# Patient Record
Sex: Female | Born: 1980 | Race: White | Hispanic: No | Marital: Married | Smoking: Never smoker
Health system: Southern US, Community
[De-identification: ages and names within clinical notes are randomized; demographics above are authoritative.]

---

## 1999-04-09 ENCOUNTER — Encounter: Payer: Self-pay | Admitting: Neurological Surgery

## 1999-04-09 ENCOUNTER — Ambulatory Visit (HOSPITAL_COMMUNITY): Admission: RE | Admit: 1999-04-09 | Discharge: 1999-04-09 | Payer: Self-pay | Admitting: Neurological Surgery

## 1999-06-09 ENCOUNTER — Encounter: Payer: Self-pay | Admitting: Neurological Surgery

## 1999-06-09 ENCOUNTER — Encounter: Admission: RE | Admit: 1999-06-09 | Discharge: 1999-06-09 | Payer: Self-pay | Admitting: Neurological Surgery

## 1999-08-15 ENCOUNTER — Ambulatory Visit (HOSPITAL_COMMUNITY): Admission: RE | Admit: 1999-08-15 | Discharge: 1999-08-15 | Payer: Self-pay | Admitting: Neurosurgery

## 1999-08-15 ENCOUNTER — Emergency Department (HOSPITAL_COMMUNITY): Admission: EM | Admit: 1999-08-15 | Discharge: 1999-08-15 | Payer: Self-pay | Admitting: Emergency Medicine

## 1999-08-15 ENCOUNTER — Encounter: Payer: Self-pay | Admitting: Emergency Medicine

## 2002-11-05 ENCOUNTER — Encounter: Payer: Self-pay | Admitting: Family Medicine

## 2002-11-05 ENCOUNTER — Ambulatory Visit (HOSPITAL_COMMUNITY): Admission: RE | Admit: 2002-11-05 | Discharge: 2002-11-05 | Payer: Self-pay | Admitting: Obstetrics and Gynecology

## 2002-11-05 ENCOUNTER — Encounter: Admission: RE | Admit: 2002-11-05 | Discharge: 2002-11-05 | Payer: Self-pay | Admitting: Family Medicine

## 2002-11-05 ENCOUNTER — Encounter: Payer: Self-pay | Admitting: Obstetrics and Gynecology

## 2003-06-12 ENCOUNTER — Inpatient Hospital Stay (HOSPITAL_COMMUNITY): Admission: EM | Admit: 2003-06-12 | Discharge: 2003-06-15 | Payer: Self-pay | Admitting: *Deleted

## 2003-07-08 ENCOUNTER — Encounter: Admission: RE | Admit: 2003-07-08 | Discharge: 2003-07-08 | Payer: Self-pay | Admitting: Internal Medicine

## 2004-04-25 ENCOUNTER — Emergency Department (HOSPITAL_COMMUNITY): Admission: EM | Admit: 2004-04-25 | Discharge: 2004-04-25 | Payer: Self-pay | Admitting: Emergency Medicine

## 2004-04-27 ENCOUNTER — Emergency Department (HOSPITAL_COMMUNITY): Admission: EM | Admit: 2004-04-27 | Discharge: 2004-04-27 | Payer: Self-pay | Admitting: Emergency Medicine

## 2004-05-11 ENCOUNTER — Encounter: Admission: RE | Admit: 2004-05-11 | Discharge: 2004-05-11 | Payer: Self-pay | Admitting: Internal Medicine

## 2004-08-04 ENCOUNTER — Other Ambulatory Visit: Admission: RE | Admit: 2004-08-04 | Discharge: 2004-08-04 | Payer: Self-pay | Admitting: Obstetrics and Gynecology

## 2005-05-23 ENCOUNTER — Encounter: Admission: RE | Admit: 2005-05-23 | Discharge: 2005-05-23 | Payer: Self-pay | Admitting: Internal Medicine

## 2005-05-25 ENCOUNTER — Encounter: Admission: RE | Admit: 2005-05-25 | Discharge: 2005-06-15 | Payer: Self-pay | Admitting: Internal Medicine

## 2006-03-23 ENCOUNTER — Other Ambulatory Visit: Admission: RE | Admit: 2006-03-23 | Discharge: 2006-03-23 | Payer: Self-pay | Admitting: Obstetrics and Gynecology

## 2007-09-15 ENCOUNTER — Inpatient Hospital Stay (HOSPITAL_COMMUNITY): Admission: AD | Admit: 2007-09-15 | Discharge: 2007-09-15 | Payer: Self-pay | Admitting: Obstetrics and Gynecology

## 2007-09-26 ENCOUNTER — Encounter (INDEPENDENT_AMBULATORY_CARE_PROVIDER_SITE_OTHER): Payer: Self-pay | Admitting: Obstetrics and Gynecology

## 2007-09-26 ENCOUNTER — Ambulatory Visit (HOSPITAL_COMMUNITY): Admission: RE | Admit: 2007-09-26 | Discharge: 2007-09-26 | Payer: Self-pay | Admitting: Obstetrics and Gynecology

## 2009-03-25 ENCOUNTER — Inpatient Hospital Stay (HOSPITAL_COMMUNITY): Admission: AD | Admit: 2009-03-25 | Discharge: 2009-03-28 | Payer: Self-pay | Admitting: Obstetrics and Gynecology

## 2009-03-29 ENCOUNTER — Encounter: Admission: RE | Admit: 2009-03-29 | Discharge: 2009-04-28 | Payer: Self-pay | Admitting: Obstetrics and Gynecology

## 2010-08-21 ENCOUNTER — Encounter: Payer: Self-pay | Admitting: Internal Medicine

## 2010-09-29 ENCOUNTER — Inpatient Hospital Stay (HOSPITAL_COMMUNITY)
Admission: AD | Admit: 2010-09-29 | Discharge: 2010-10-01 | DRG: 775 | Disposition: A | Discharge status: Home / Self Care | Payer: 59 | Source: Ambulatory Visit | Attending: Obstetrics and Gynecology | Admitting: Obstetrics and Gynecology

## 2010-09-29 LAB — CBC
HCT: 38.8 % (ref 36.0–46.0)
Hemoglobin: 13.8 g/dL (ref 12.0–15.0)
MCH: 33 pg (ref 26.0–34.0)
MCHC: 35.6 g/dL (ref 30.0–36.0)
MCV: 92.8 fL (ref 78.0–100.0)
Platelets: 239 10*3/uL (ref 150–400)
RBC: 4.18 MIL/uL (ref 3.87–5.11)
RDW: 12.1 % (ref 11.5–15.5)
WBC: 15.8 10*3/uL — ABNORMAL HIGH (ref 4.0–10.5)

## 2010-09-29 LAB — RPR: RPR Ser Ql: NONREACTIVE

## 2010-09-30 LAB — CBC
HCT: 34 % — ABNORMAL LOW (ref 36.0–46.0)
Hemoglobin: 11.5 g/dL — ABNORMAL LOW (ref 12.0–15.0)
MCH: 31.9 pg (ref 26.0–34.0)
MCHC: 33.8 g/dL (ref 30.0–36.0)
MCV: 94.4 fL (ref 78.0–100.0)
Platelets: 206 10*3/uL (ref 150–400)
RBC: 3.6 MIL/uL — ABNORMAL LOW (ref 3.87–5.11)
RDW: 12.3 % (ref 11.5–15.5)
WBC: 13.9 10*3/uL — ABNORMAL HIGH (ref 4.0–10.5)

## 2010-10-08 NOTE — Discharge Summary (Signed)
  NAME:  Michelle Sullivan, Michelle Sullivan          ACCOUNT NO.:  [id number]  MEDICAL RECORD NO.:  [id number]           PATIENT TYPE:  I  LOCATION:  9125                          FACILITY:  [facility]  PHYSICIAN:  Huel Cote, M.D. DATE OF BIRTH:  02-13-1981  DATE OF ADMISSION:  09/29/2010 DATE OF DISCHARGE:  10/01/2010                              DISCHARGE SUMMARY   DISCHARGE DIAGNOSES: 1. Term pregnancy at 40-6/7 weeks delivered. 2. Status post normal spontaneous vaginal delivery. 3. Second-degree laceration repaired.  DISCHARGE MEDICATIONS:  Motrin 600 mg p.o. every 6 hours.  DISCHARGE FOLLOWUP:  The patient is to follow up in 6 weeks for her full postpartum exam.  HOSPITAL COURSE:  The patient is a 30 year old G3, P1-0-1-1 who was admitted at 45 and 6/[redacted] weeks gestation in active labor.  Cervix on admission was 7-8 cm with intact membranes.  Her prenatal care had been uneventful.  PRENATAL LABS:  A+ antibody negative, rubella immune, hepatitis B surface antigen negative, HIV negative, RPR negative, GC negative, Chlamydia negative, group B strep negative, 1-hour Glucola 103, first trimester screen negative, AFP negative.  PAST OBSTETRICAL HISTORY:  In 2009, she had spontaneous miscarriage with a D&C.  In 2010, she had a vaginal delivery of a 7-pound 11-ounce infant.  PAST GYN HISTORY:  No abnormal Pap smears.  PAST SURGICAL HISTORY:  D&C.  PAST MEDICAL HISTORY:  Anemia, anxiety requiring no medications, C-spine fracture, and a head injury.  ALLERGIES:  AVELOX, BIAXIN, and CEFTIN.  MEDICATIONS:  Folic acid and prenatal vitamins.  SOCIAL HISTORY:  She is married, homemaker, uses no tobacco, alcohol or drugs.  On admission, she was afebrile with stable vital signs.  Fetal heart rate was reassuring.  Cervix was completely effaced 7-8 cm, 0 station and intact bag of water.  She declined pain medications and AROM.  On admission, she did well for the next hour and a half and  reached complete dilation.  At that point, she had rupture of membranes with light meconium noted.  Shortly thereafter, she pushed great with a normal spontaneous vaginal delivery of a vigorous female infant over second-degree laceration.  Apgar's were 8 and 9, weight was 8 pounds 2 ounces.  Estimated blood loss 400 mL.  Her second-degree laceration was repaired with 3-0 Vicryl Rapide.  Cervix and rectum were intact.  On postpartum day #1, she was doing quite well. Hemoglobin was good at 11.5.  By postpartum day #2, she was tolerating her pain with p.o. medications having no difficulty.  Her lochia was slowing and she was considered stable for discharge home.     Huel Cote, M.D.     KR/MEDQ  D:  10/01/2010  T:  10/01/2010  Job:  [id number]  Electronically Signed by Huel Cote M.D. on 10/07/2010 11:19:32 AM

## 2010-11-06 LAB — CBC
HCT: 39.4 % (ref 36.0–46.0)
Hemoglobin: 13.7 g/dL (ref 12.0–15.0)
MCHC: 34.7 g/dL (ref 30.0–36.0)
MCV: 96.6 fL (ref 78.0–100.0)
Platelets: 257 10*3/uL (ref 150–400)
RBC: 4.08 MIL/uL (ref 3.87–5.11)
RDW: 12 % (ref 11.5–15.5)
WBC: 13.8 10*3/uL — ABNORMAL HIGH (ref 4.0–10.5)

## 2010-11-06 LAB — RPR: RPR Ser Ql: NONREACTIVE

## 2010-12-14 NOTE — H&P (Signed)
NAME:  Michelle Sullivan, Michelle Sullivan NO.:  [id number]   MEDICAL RECORD NO.:  [id number]          PATIENT TYPE:  AMB   LOCATION:  SDC                           FACILITY:  [facility]   PHYSICIAN:  Huel Cote, M.D. DATE OF BIRTH:  08/04/1980   DATE OF ADMISSION:  09/26/2007  DATE OF DISCHARGE:                              HISTORY & PHYSICAL   The patient is a 30 year old female, G1 P0, who is coming in for a D&C  for a known missed abortion.  She had been being followed in our office  with inappropriately rising betas and an ultrasound which demonstrated  an  empty gestational sac with no fetal embryo developing.  She was  given multiple options and elected for a D&C.   PAST MEDICAL HISTORY:  Significant for history of compression fractures  at L1, L2 and L3 and migraines.   PAST SURGICAL HISTORY:  None.   PAST OBSTETRICAL HISTORY:  None.   PAST GYN HISTORY:  None.   She has a history of an ovarian cancer in a paternal aunt.  No other  significant medical problems.   ALLERGIES:  BIAXIN, CEFTIN and AVELOX.   CURRENT MEDICATIONS:  Vitamins only.   PHYSICAL EXAM:  Blood pressure is 120/65.  Weight is 139 pounds.  BREASTS:  No masses, discharge or adenopathy noted.  CARDIAC:  Regular rate and rhythm.  LUNGS:  Clear.  ABDOMEN:  Soft and nontender.  She has normal external genitalia.  Cervix is normal.  Uterus is  enlarged to approximately 8 weeks' size and she has small adnexa with no  masses noted.   In short, this is a female who is approximately 9-10 weeks by her last  menstrual period with a missed abortion confirmed on several serial  ultrasounds and a gestational sac with no embryonic pole.  The sac  measures approximately 2.5-3 cm.  The patient was counseled as to  options including expectant management, Cytotec and D&C and elected to  proceed with a D&C.  She understands the risks of uterine perforation  and bleeding and desires to proceed as stated.      Huel Cote, M.D.  Electronically Signed    KR/MEDQ  D:  09/25/2007  T:  09/25/2007  Job:  [id number]

## 2010-12-14 NOTE — Op Note (Signed)
NAME:  Michelle Sullivan, Michelle Sullivan          ACCOUNT NO.:  [id number]   MEDICAL RECORD NO.:  [id number]          PATIENT TYPE:  AMB   LOCATION:  SDC                           [facility]   PHYSICIAN:  Huel Cote, M.D. DATE OF BIRTH:  [birth year]   DATE OF PROCEDURE:  09/26/2007  DATE OF DISCHARGE:                               OPERATIVE REPORT   PREOPERATIVE DIAGNOSIS:  Missed abortion and 9-[redacted] weeks gestation.   POSTOPERATIVE DIAGNOSIS:  Missed abortion and 9-[redacted] weeks gestation.   PROCEDURE:  Suction dilation and curettage.   SURGEON:  Dr. Huel Cote.   ANESTHESIA:  MAC with 1% paracervical lidocaine block.   FINDINGS:  Moderate amount of products of conception. The uterus was  approximately 8 weeks in size.   ESTIMATED BLOOD LOSS:  Minimal.   URINE OUTPUT:  50 mL clear urine, straight cath prior to procedure.   IV FLUIDS:  1200 mL LR.   PROCEDURE:  The patient was taken to operating room where MAC anesthesia  was obtained without difficulty.  She was then prepped and draped in  normal sterile fashion in dorsal lithotomy position.  After the bladder  was emptied the cervix was identified with a speculum in place and  grasped with a single-tooth tenaculum at the anterior lip.  It was then  injected at both two and 10 o'clock with 1% plain lidocaine  approximately 10 mL at each area.  The cervix was noted to be somewhat  dilated already from the Cytotec placement and was sounded to  approximately 8 cm.  A 7 mm suction curette was chosen and after serial  dilation which was very easy to perform, the 7 mm suction curette  introduced into the uterine fundus.  In several passes a moderate amount  of products of conception were obtained.  With this seemed to be  minimizing, suction was discontinued and a sharp curettage was performed  with no other significant tissue palpated.  Two additional passes were  made with only very little amount of tissue noted.  At this point the  tenaculum was removed.  Small amount of silver nitrate placed at  tenaculum site and there was no active bleeding noted.  The speculum was  removed and the patient taken to the recovery room in good condition.      Huel Cote, M.D.  Electronically Signed     KR/MEDQ  D:  09/26/2007  T:  09/27/2007  Job:  [id number]

## 2010-12-14 NOTE — Discharge Summary (Signed)
NAME:  TATUMN, CORBRIDGE          ACCOUNT NO.:  [id number]   MEDICAL RECORD NO.:  [id number]          PATIENT TYPE:  INP   LOCATION:  9119                          [facility]ILITY:  [facility]   PHYSICIAN:  Malachi Pro. Ambrose Mantle, M.D. DATE OF BIRTH:  [birth year]   DATE OF ADMISSION:  03/25/2009  DATE OF DISCHARGE:  03/28/2009                               DISCHARGE SUMMARY   HISTORY OF PRESENT ILLNESS:  A 30 year old white female para 0-0-1-0  gravida 2, estimated gestational age [redacted] weeks by last period compatible  with a 7-week ultrasound with [facility] March 24, 2009 presented to the  office complaining of regular contractions and bloody show, no ruptured  membranes, good fetal movement.  Vaginal exam in the office, cervix was  5 cm, 80% vertex at -1.  Prenatal care was essentially uncomplicated  except for episodes of dizziness treated with Dramamine.  RPR was  nonreactive, hepatitis B surface antigen negative, rubella was  equivocal, HIV negative, A positive with negative antibody, GC and  chlamydia negative, cystic fibrosis negative.  First trimester screen  normal.  AFP normal.  One-hour Glucola 84.  Group B strep negative.  The  patient had a spontaneous abortion with a D&C.  She had a history of  head trauma, headaches, compression fracture, L1-L3 panic attacks, and  anxiety.   SURGICAL HISTORY:  D&C.   ALLERGIES:  CEFTIN, BIAXIN, AVELOX.   MEDICATIONS:  Prenatal vitamins.   PHYSICAL EXAMINATION:  On admission reveal normal vital signs.  Heart  and lungs were normal.  The abdomen was soft and gravid.  Estimated  fetal weight 7-1/2 pounds.  Contractions every 3-5 minutes.  The fetal  heart tones were reactive.  Cervix was 6 cm, 90% vertex at -1.  By 9  p.m., the cervix was 8 cm, 90%.  Artificial rupture of membranes showed  possible slight meconium.  The patient progressed to complete dilatation  and pushed for approximately 2 hours.  Fetal heart tones were reassuring  with intermittent  monitoring.  Spontaneous vaginal delivery of a 7-pound  11-ounce female infant, Apgars of 7 at 1 and 9 at 5 minutes.  As the  baby was delivered, the mom and the nurse pulled the baby to the mom's  abdomen and the umbilical cord which was short tore approximately 10 cm  from the baby.  Dr. Jackelyn Knife was eventually able to clamp the cord.  The placenta delivered spontaneously and intact.  Second-degree  laceration was repaired with 3-0 Vicryl.  Blood loss about 500 mL.  Postpartum, the patient did well and was discharged on the second  postpartum day.   LABORATORY DATA:  Laboratory data showed a nonreactive serology,  hemoglobin 13.7, hematocrit 39.4, white count 13,800, platelet count  257,000.   FINAL DIAGNOSIS:  Intrauterine pregnancy at 40 weeks, delivered vertex.   OPERATION:  Spontaneous delivery vertex, repair of second-degree  laceration.   FINAL CONDITION:  Improved.   INSTRUCTIONS:  Regular discharge instruction booklet.  The patient is  advised to return in 6 weeks.  Motrin 600 mg 30 tablets one every 6  hours as needed for pain given at discharge.  Malachi Pro. Ambrose Mantle, M.D.  Electronically Signed     TFH/MEDQ  D:  03/28/2009  T:  03/28/2009  Job:  [id number]

## 2010-12-17 NOTE — H&P (Signed)
NAME:  Michelle Sullivan, Michelle Sullivan                         ACCOUNT NO.:  [id number]   MEDICAL RECORD NO.:  [id number]                   PATIENT TYPE:  EMS   LOCATION:  MAJO                                 FACILITY:  [facility]   PHYSICIAN:  Melissa L. Ladona Ridgel, MD               DATE OF BIRTH:  03-11-1981   DATE OF ADMISSION:  06/12/2003  DATE OF DISCHARGE:                                HISTORY & PHYSICAL   PRIMARY MEDICAL DOCTOR:  Dr. Duncan Dull.   NEUROLOGIST:  Dr. Marlan Palau.   CHIEF COMPLAINT:  Diarrhea and near-syncopal episode.   HISTORY OF PRESENT ILLNESS:  The patient was treated for bilateral ear  infections three weeks ago with Biaxin.  For approximately the past week,  she has felt increasing weakness, had headaches off and on, as well as  abdominal pain and bloating.  She began to have watery stools this morning  in large volumes accompanied by nausea.  She denies vomiting or fever.  She  has had chills, diaphoresis and near-syncopal episodes.  The patient does  have a history of near-syncopal episodes which have been evaluated by Dr.  Anne Hahn and found to be benign.  The patient has not had a clear syncopal  episode today.   The patient has had no recent exposure to similar illnesses.  She did go  camping in September of 2004 but did not drink water that was not treated.  She has had no recent tick bites, no dietary indiscretions, no sick  contacts.   She denies vaginal complaints.  Her last menstrual period started yesterday  on June 11, 2003.  She has had minimal urine output but denies hematuria  or dysuria.  Stools are clear liquid with brown and black flakes.  She  denies visible blood or black stools.  ROS is otherwise negative.   PAST MEDICAL HISTORY:  1. Migraine headaches.  2. Ovarian cyst.  3. Anxiety disorder.  4. Status post L1, 2 and 3 compression fractures four years ago.   MEDICATIONS:  1. Naproxen p.r.n. for headaches.  2. Imitrex p.r.n. for  headaches.  3. Biaxin three weeks ago, which is not current.   ALLERGIES:  No known drug allergies.   SOCIAL HISTORY:  The patient works as a Engineer, site.  She lives with her  family including two parents and two younger sisters.  She does not smoke,  drink alcohol or use illicit drugs.   FAMILY HISTORY:  The patient denies any family history of colitis or other  GI diseases, otherwise, noncontributory.   REVIEW OF SYSTEMS:  Please see HPI.   PHYSICAL EXAMINATION:  GENERAL:  This is a thin, well-developed white female  who is somewhat pale but in no acute distress, lying on the stretcher.  VITAL SIGNS:  Temperature 99.2, blood pressure 98/56, pulse 76, respirations  16, room air saturations of 100%.  HEENT:  Tympanic membranes  within normal limits.  PERRLA, 4 mm bilaterally.  EOMI.  Mucous membranes are dry.  There is mild erythema to the oropharynx.  NECK:  She has no adenopathy, bruit, mass or thyromegaly to her neck.  CHEST:  Good expansion and air movement.  Lungs are clear bilaterally.  HEART:  Heart rate and rhythm are regular without murmur, rub or gallop.  ABDOMEN:  Abdomen is flat, soft, with hyperactive bowel sounds.  There is  diffuse tenderness, worse in the left lower quadrant, and rebound tenderness  in the left lower quadrant.  No CVAT.  RECTAL:  Exam reveals normal sphincter tone with no external hemorrhoids, no  stool obtained; vault is empty.  EXTREMITIES:  The patient has no edema to her extremities.  Her feet are  cool with good capillary refill and dorsalis pedis pulses are +1  bilaterally.   OBJECTIVE DATA:  White blood cell count 6.6, hemoglobin 13.7, hematocrit  40.4, platelet count 240,000.  Urine pregnancy is negative.  Lipase is 20,  sodium 140, potassium 4.0, BUN 10, glucose 101.  Urine specific gravity is  greater than 1.030, pH is 5.0, is positive for hemoglobin (patient is  currently menstruating), nitrite and leukocyte esterase negative.    Acute abdominal series has been ordered but is pending at time of dictation.   ASSESSMENT AND PLAN:  1. Dehydration secondary to diarrhea:  Aggressive rehydration, monitor     electrolytes, clear liquid diet to start.  2. Diarrhea:  Check stool for C. difficile, white blood cell count and ova     and parasites.  The patient has recently had antibiotic therapy.  Treat     diarrhea symptomatically.  Check acute abdominal series and stress ulcer     prophylaxis with Protonix.  Clear liquid diet as tolerated.      Ellender Hose. Davis, N.P.                    Melissa L. Ladona Ridgel, MD    SMD/MEDQ  D:  06/12/2003  T:  06/12/2003  Job:  [id number]

## 2010-12-17 NOTE — Discharge Summary (Signed)
NAME:  Michelle Sullivan, Michelle Sullivan                         ACCOUNT NO.:  [id number]   MEDICAL RECORD NO.:  [id number]                   PATIENT TYPE:  INP   LOCATION:  5035                                 FACILITY:  [facility]   PHYSICIAN:  Melissa L. Ladona Ridgel, MD               DATE OF BIRTH:  [birth year]   DATE OF ADMISSION:  06/12/2003  DATE OF DISCHARGE:  06/15/2003                                 DISCHARGE SUMMARY   ADMITTING DIAGNOSES:  1. Diarrhea.  2. Near syncope.   DISCHARGE DIAGNOSES:  1. Dehydration.  2. Left thigh pain, resolved with no evidence for deep vein thrombosis.  3. Otitis media bilaterally being treated.   HISTORY OF PRESENT ILLNESS:  The patient is a 30 year old white female with  a past medical history significant for migraines, anxiety disorder, and L1  to L3 compression fractures four years prior to admission.  She presents to  the emergency room with diarrhea, copious in volume, accompanied by nausea  but no vomiting, no fever or chills, and a complaint of feeling dizzy to the  point of passing out.  In the emergency room, she was evaluated and found to  have a near syncopal episode when she stood to go to the restroom.  The  patient was admitted to the medical floor for aggressive rehydration.  Because she gave a history of recent travel to [place] with symptoms that  persisted since that time.  Stool cultures for ova parasites, Giardia,  Isospora and Cyclospora were sent.  The patient also gave a history for  bilateral ear infections treated three weeks prior to admission with Biaxin.  During the course of the hospitalization, the patient responded  appropriately to rehydration.  She was able to start clear liquids without  nausea or vomiting.  She still did complain of dizziness especially with  standing.  She was noted to be orthostatic additionally during the hospital  stay.  She also complained of bilateral ear fullness which on examination  revealed residual  bilateral otitis media.  She was therefore started on  moxifloxacin p.o. because of her recent treatment with Biaxin and severe  diarrhea which appeared to follow.  Of note during the hospitalization, a  urine pregnancy was negative.  She had no evidence of pancreatitis with a  lipase of 20.  She did not display any urinary tract infection.  There was  positive hemoglobin in her urine secondary to her menstruation which had  started on the day of admission.  Also of note, the patient gave no history  for recent contact with farm animals, in particular, she did not attend the  state fair.  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  Her stools remained heme negative during  the course of the admission.  She was not placed on antibiotics to treat her  diarrhea which resolved over  the course of two days.  Her course was  complicated on the day prior to admission by the complaint of left-thigh  pain, intense in nature, acute in onset.  A stat vascular ultrasound was  undertaken and was felt to be negative for deep vein thrombosis.  Also  during the course of the hospitalization, the patient complained of severe  episodes of anxiety and fear.  These were resolved with talking with the  patient and reassuring her that her symptoms would resolve.  On the day of  admission, it was deemed that her diarrhea was secondary to possibly to a  viral gastroenteritis versus as a sequelae to her antibiotic therapy.  However, the timing for the onset appeared to be remote.  She was adequately  rehydrated on the day of admission.   PHYSICAL EXAMINATION:  VITAL SIGNS:  Her vital signs were stable.  Her blood  pressure was 107/52.  Her temperature was 97.2.  Her pulse was 56 with a  room air saturation of 100%.  GENERAL:  She was generally in no acute distress.  HEENT:  Pupils were equal, round and reactive to light.  Her mucous  membranes were moist.   CARDIOVASCULAR:  Exam revealed regular rate and rhythm, positive S1, S2, no  S3 or S4.  ABDOMEN:  Soft, nontender, nondistended with positive bowel sounds.  EXTREMITIES:  Showed no edema.  She was tolerating an oral diet.  She was  discharged to home with follow up to her primary care physician.  She stated  she was interested in changing over to an [facility], and referral to  Dr. Mayra Neer was given.  The family was going to talk with the office  to see if she is accepting new patients.   ASSESSMENT/PLAN:  This is a 30 year old white female admitted with  dehydration secondary to diffuse diarrhea of unclear etiology but likely  secondary to a viral gastroenteritis.  Stool cultures were obtained and were  deemed to be negative which was conveyed to the patient post discharge.  1. GI:  Diarrhea, resolved without medical or chemotherapeutic input.  She     was discharged to home to advance her diet as tolerated.  2. Anxiety.  It was recommended that the patient seek further psychological     support.  She had previously seen a psychologist and psychiatrist and had     been taking an SRR in the past.  It was also recommended that when she     sees her primary care physician, that they entertain the workup of her     thyroid, although during the admission, there were no signs or symptoms     that were suggestive of hyperthyroidism, necessitating further     investigation.  Outpatient followup, however, would be warranted.  3. Otitis media.  Because of the recurrence or progression of her otitis, I     started the patient on moxifloxacin.  After the initial dose of     moxifloxacin, the patient experienced an episode of redness in her left     neck with dizziness.  This was just following injection of Phenergan with     morphine to treat her leg pain.  I do not believe that this is related to    the moxifloxacin, and will continue the antibiotic post discharge.  She     is also  instructed to use aspirin x3 days to try to open her eustachian     tubes but  not to continue beyond three days.   DISCHARGE CONDITION:  Stable.   ADDENDUM:  A followup call was place to the patient status post discharge.  She had experienced complaint of increased dizziness after taking her  moxifloxacin.  She therefore was switched to Augmentin 500, 125 p.o. q.12  hours with continuation of her Nexium 40 mg p.o. daily to cover the  gastrointestinal side effects of the antibiotic.  She was further instructed  to make contact with her primary care physician to followup up her otitis  media and continue the workup for her anxiety.                                                Melissa L. Ladona Ridgel, MD    MLT/MEDQ  D:  06/20/2003  T:  06/21/2003  Job:  [id number]

## 2011-04-22 LAB — CBC
HCT: 35.3 — ABNORMAL LOW
HCT: 37
Hemoglobin: 12.7
Hemoglobin: 13.1
MCHC: 35.3
MCHC: 35.9
MCV: 92.8
MCV: 94.9
Platelets: 246
Platelets: 252
RBC: 3.8 — ABNORMAL LOW
RBC: 3.9
RDW: 12.4
RDW: 12.6
WBC: 11.9 — ABNORMAL HIGH
WBC: 9.6

## 2011-04-22 LAB — URINALYSIS, ROUTINE W REFLEX MICROSCOPIC
Bilirubin Urine: NEGATIVE
Glucose, UA: NEGATIVE
Hgb urine dipstick: NEGATIVE
Ketones, ur: 15 — AB
Nitrite: NEGATIVE
Protein, ur: NEGATIVE
Specific Gravity, Urine: 1.02
Urobilinogen, UA: 0.2
pH: 6.5

## 2011-04-22 LAB — POCT PREGNANCY, URINE
Operator id: 12753
Preg Test, Ur: POSITIVE

## 2011-04-22 LAB — ABO/RH: ABO/RH(D): A POS

## 2011-05-02 ENCOUNTER — Other Ambulatory Visit: Payer: Self-pay | Admitting: Family Medicine

## 2011-05-02 DIAGNOSIS — R102 Pelvic and perineal pain: Secondary | ICD-10-CM

## 2011-05-03 ENCOUNTER — Ambulatory Visit
Admission: RE | Admit: 2011-05-03 | Discharge: 2011-05-03 | Disposition: A | Discharge status: Home / Self Care | Payer: 59 | Source: Ambulatory Visit | Attending: Family Medicine | Admitting: Family Medicine

## 2011-05-03 DIAGNOSIS — R102 Pelvic and perineal pain: Secondary | ICD-10-CM

## 2013-05-14 ENCOUNTER — Other Ambulatory Visit: Payer: Self-pay | Admitting: Internal Medicine

## 2013-05-14 ENCOUNTER — Ambulatory Visit
Admission: RE | Admit: 2013-05-14 | Discharge: 2013-05-14 | Disposition: A | Discharge status: Home / Self Care | Payer: 59 | Source: Ambulatory Visit | Attending: Internal Medicine | Admitting: Internal Medicine

## 2013-05-14 DIAGNOSIS — S0990XA Unspecified injury of head, initial encounter: Secondary | ICD-10-CM

## 2013-11-11 ENCOUNTER — Other Ambulatory Visit: Payer: Self-pay | Admitting: Obstetrics and Gynecology

## 2013-11-11 DIAGNOSIS — N6459 Other signs and symptoms in breast: Secondary | ICD-10-CM

## 2013-11-19 ENCOUNTER — Ambulatory Visit
Admission: RE | Admit: 2013-11-19 | Discharge: 2013-11-19 | Disposition: A | Discharge status: Home / Self Care | Payer: 59 | Source: Ambulatory Visit | Attending: Obstetrics and Gynecology | Admitting: Obstetrics and Gynecology

## 2013-11-19 ENCOUNTER — Encounter (INDEPENDENT_AMBULATORY_CARE_PROVIDER_SITE_OTHER): Payer: Self-pay

## 2013-11-19 DIAGNOSIS — N6459 Other signs and symptoms in breast: Secondary | ICD-10-CM

## 2015-11-13 ENCOUNTER — Other Ambulatory Visit: Payer: Self-pay | Admitting: Gynecology

## 2015-11-13 DIAGNOSIS — N63 Unspecified lump in unspecified breast: Secondary | ICD-10-CM

## 2015-11-16 ENCOUNTER — Other Ambulatory Visit: Payer: Self-pay | Admitting: Gynecology

## 2015-11-16 DIAGNOSIS — N63 Unspecified lump in unspecified breast: Secondary | ICD-10-CM

## 2015-11-20 ENCOUNTER — Ambulatory Visit
Admission: RE | Admit: 2015-11-20 | Discharge: 2015-11-20 | Disposition: A | Discharge status: Home / Self Care | Payer: 59 | Source: Ambulatory Visit | Attending: Gynecology | Admitting: Gynecology

## 2015-11-20 DIAGNOSIS — N63 Unspecified lump in unspecified breast: Secondary | ICD-10-CM

## 2017-01-19 DIAGNOSIS — Z0000 Encounter for general adult medical examination without abnormal findings: Secondary | ICD-10-CM | POA: Diagnosis not present

## 2017-01-19 DIAGNOSIS — W57XXXA Bitten or stung by nonvenomous insect and other nonvenomous arthropods, initial encounter: Secondary | ICD-10-CM | POA: Diagnosis not present

## 2017-01-19 DIAGNOSIS — S70262A Insect bite (nonvenomous), left hip, initial encounter: Secondary | ICD-10-CM | POA: Diagnosis not present

## 2017-02-14 DIAGNOSIS — Z01419 Encounter for gynecological examination (general) (routine) without abnormal findings: Secondary | ICD-10-CM | POA: Diagnosis not present

## 2017-02-21 DIAGNOSIS — L820 Inflamed seborrheic keratosis: Secondary | ICD-10-CM | POA: Diagnosis not present

## 2017-05-24 ENCOUNTER — Other Ambulatory Visit: Payer: Self-pay | Admitting: Internal Medicine

## 2017-05-24 DIAGNOSIS — R1031 Right lower quadrant pain: Secondary | ICD-10-CM | POA: Diagnosis not present

## 2017-05-24 DIAGNOSIS — R1030 Lower abdominal pain, unspecified: Secondary | ICD-10-CM | POA: Diagnosis not present

## 2017-05-25 DIAGNOSIS — R109 Unspecified abdominal pain: Secondary | ICD-10-CM | POA: Diagnosis not present

## 2017-06-02 ENCOUNTER — Ambulatory Visit
Admission: RE | Admit: 2017-06-02 | Discharge: 2017-06-02 | Disposition: A | Discharge status: Home / Self Care | Payer: 59 | Source: Ambulatory Visit | Attending: Internal Medicine | Admitting: Internal Medicine

## 2017-06-02 DIAGNOSIS — R1031 Right lower quadrant pain: Secondary | ICD-10-CM

## 2017-06-02 DIAGNOSIS — R102 Pelvic and perineal pain: Secondary | ICD-10-CM | POA: Diagnosis not present

## 2018-01-25 DIAGNOSIS — T63441A Toxic effect of venom of bees, accidental (unintentional), initial encounter: Secondary | ICD-10-CM | POA: Diagnosis not present

## 2018-02-15 ENCOUNTER — Other Ambulatory Visit: Payer: Self-pay | Admitting: Internal Medicine

## 2018-02-15 DIAGNOSIS — R252 Cramp and spasm: Secondary | ICD-10-CM | POA: Diagnosis not present

## 2018-02-15 DIAGNOSIS — R631 Polydipsia: Secondary | ICD-10-CM | POA: Diagnosis not present

## 2018-02-15 DIAGNOSIS — R5383 Other fatigue: Secondary | ICD-10-CM | POA: Diagnosis not present

## 2018-02-16 ENCOUNTER — Ambulatory Visit
Admission: RE | Admit: 2018-02-16 | Discharge: 2018-02-16 | Disposition: A | Discharge status: Home / Self Care | Payer: 59 | Source: Ambulatory Visit | Attending: Internal Medicine | Admitting: Internal Medicine

## 2018-02-16 DIAGNOSIS — M79662 Pain in left lower leg: Secondary | ICD-10-CM | POA: Diagnosis not present

## 2018-02-16 DIAGNOSIS — R252 Cramp and spasm: Secondary | ICD-10-CM

## 2018-06-25 DIAGNOSIS — N644 Mastodynia: Secondary | ICD-10-CM | POA: Diagnosis not present

## 2018-10-03 DIAGNOSIS — Z6825 Body mass index (BMI) 25.0-25.9, adult: Secondary | ICD-10-CM | POA: Diagnosis not present

## 2018-10-03 DIAGNOSIS — Z01419 Encounter for gynecological examination (general) (routine) without abnormal findings: Secondary | ICD-10-CM | POA: Diagnosis not present

## 2018-10-03 DIAGNOSIS — Z130 Encounter for screening for diseases of the blood and blood-forming organs and certain disorders involving the immune mechanism: Secondary | ICD-10-CM | POA: Diagnosis not present

## 2018-11-29 DIAGNOSIS — D2261 Melanocytic nevi of right upper limb, including shoulder: Secondary | ICD-10-CM | POA: Diagnosis not present

## 2018-11-29 DIAGNOSIS — D225 Melanocytic nevi of trunk: Secondary | ICD-10-CM | POA: Diagnosis not present

## 2018-11-29 DIAGNOSIS — D2262 Melanocytic nevi of left upper limb, including shoulder: Secondary | ICD-10-CM | POA: Diagnosis not present

## 2019-04-08 ENCOUNTER — Other Ambulatory Visit: Payer: Self-pay

## 2019-04-08 ENCOUNTER — Emergency Department (HOSPITAL_COMMUNITY)
Admission: EM | Admit: 2019-04-08 | Discharge: 2019-04-08 | Disposition: A | Discharge status: Home / Self Care | Payer: 59 | Attending: Emergency Medicine | Admitting: Emergency Medicine

## 2019-04-08 ENCOUNTER — Emergency Department (HOSPITAL_COMMUNITY): Payer: 59

## 2019-04-08 ENCOUNTER — Encounter (HOSPITAL_COMMUNITY): Payer: Self-pay | Admitting: Emergency Medicine

## 2019-04-08 DIAGNOSIS — R519 Headache, unspecified: Secondary | ICD-10-CM

## 2019-04-08 DIAGNOSIS — Z79899 Other long term (current) drug therapy: Secondary | ICD-10-CM | POA: Diagnosis not present

## 2019-04-08 DIAGNOSIS — R0602 Shortness of breath: Secondary | ICD-10-CM | POA: Diagnosis not present

## 2019-04-08 DIAGNOSIS — R51 Headache: Secondary | ICD-10-CM | POA: Insufficient documentation

## 2019-04-08 DIAGNOSIS — R42 Dizziness and giddiness: Secondary | ICD-10-CM | POA: Diagnosis not present

## 2019-04-08 DIAGNOSIS — M542 Cervicalgia: Secondary | ICD-10-CM | POA: Insufficient documentation

## 2019-04-08 DIAGNOSIS — N179 Acute kidney failure, unspecified: Secondary | ICD-10-CM | POA: Diagnosis not present

## 2019-04-08 DIAGNOSIS — R55 Syncope and collapse: Secondary | ICD-10-CM | POA: Insufficient documentation

## 2019-04-08 LAB — BASIC METABOLIC PANEL
Anion gap: 9 (ref 5–15)
BUN: 12 mg/dL (ref 6–20)
CO2: 25 mmol/L (ref 22–32)
Calcium: 9.3 mg/dL (ref 8.9–10.3)
Chloride: 105 mmol/L (ref 98–111)
Creatinine, Ser: 1.27 mg/dL — ABNORMAL HIGH (ref 0.44–1.00)
GFR calc Af Amer: >60 mL/min (ref >60)
GFR calc non Af Amer: 53 mL/min — ABNORMAL LOW (ref >60)
Glucose, Bld: 113 mg/dL — ABNORMAL HIGH (ref 70–99)
Potassium: 4.1 mmol/L (ref 3.5–5.1)
Sodium: 139 mmol/L (ref 135–145)

## 2019-04-08 LAB — URINALYSIS, ROUTINE W REFLEX MICROSCOPIC
Bilirubin Urine: NEGATIVE
Glucose, UA: NEGATIVE mg/dL
Hgb urine dipstick: NEGATIVE
Ketones, ur: NEGATIVE mg/dL
Leukocytes,Ua: NEGATIVE
Nitrite: NEGATIVE
Protein, ur: NEGATIVE mg/dL
Specific Gravity, Urine: 1.008 (ref 1.005–1.030)
pH: 5 (ref 5.0–8.0)

## 2019-04-08 LAB — TROPONIN I (HIGH SENSITIVITY)
Troponin I (High Sensitivity): 3 ng/L (ref <18)
Troponin I (High Sensitivity): <2 ng/L (ref <18)

## 2019-04-08 LAB — CBC
HCT: 37.5 % (ref 36.0–46.0)
Hemoglobin: 12.9 g/dL (ref 12.0–15.0)
MCH: 32.3 pg (ref 26.0–34.0)
MCHC: 34.4 g/dL (ref 30.0–36.0)
MCV: 94 fL (ref 80.0–100.0)
Platelets: 247 10*3/uL (ref 150–400)
RBC: 3.99 MIL/uL (ref 3.87–5.11)
RDW: 11.4 % — ABNORMAL LOW (ref 11.5–15.5)
WBC: 8.3 10*3/uL (ref 4.0–10.5)
nRBC: 0 % (ref 0.0–0.2)

## 2019-04-08 LAB — I-STAT BETA HCG BLOOD, ED (MC, WL, AP ONLY): I-stat hCG, quantitative: <5 m[IU]/mL (ref <5)

## 2019-04-08 LAB — CBG MONITORING, ED: Glucose-Capillary: 108 mg/dL — ABNORMAL HIGH (ref 70–99)

## 2019-04-08 LAB — TSH: TSH: 1.762 u[IU]/mL (ref 0.350–4.500)

## 2019-04-08 MED ORDER — MECLIZINE HCL 25 MG PO TABS: 25.0000 mg | ORAL_TABLET | Freq: Three times a day (TID) | ORAL | 0 refills | Status: DC | PRN

## 2019-04-08 MED ORDER — IOHEXOL 350 MG/ML SOLN
75.0000 mL | Freq: Once | INTRAVENOUS | Status: AC | PRN
  Administered 2019-04-08: 100 mL via INTRAVENOUS

## 2019-04-08 MED ORDER — SODIUM CHLORIDE 0.9% FLUSH
3.0000 mL | Freq: Once | INTRAVENOUS | Status: AC
  Administered 2019-04-08: 3 mL via INTRAVENOUS

## 2019-04-08 MED ORDER — SODIUM CHLORIDE 0.9 % IV BOLUS
1000.0000 mL | Freq: Once | INTRAVENOUS | Status: AC
  Administered 2019-04-08: 1000 mL via INTRAVENOUS

## 2019-04-08 MED ORDER — LORAZEPAM 2 MG/ML IJ SOLN: 1.0000 mg | Freq: Once | INTRAMUSCULAR | Status: DC

## 2019-04-08 NOTE — ED Notes (Signed)
Patient verbalizes understanding of discharge instructions. Opportunity for questioning and answers were provided. Armband removed by staff, pt discharged from ED ambulatory.   

## 2019-04-08 NOTE — Discharge Instructions (Addendum)
Take the meclizine as prescribed.  Return for any new worsening symptoms.  May follow-up with PCP for reevaluation.

## 2019-04-08 NOTE — ED Provider Notes (Signed)
[facility] Provider Note   CSN: [id number] Arrival date & time: 04/08/19  1428   History   Chief Complaint Chief Complaint  Patient presents with   Dizziness   Headache   HPI Michelle Sullivan is a 38 y.o. female with no significant past medical history who presents for evaluation of dizziness.  States she has had intermittent episodes of dizziness and lightheadedness over the past 4 weeks.  Patient states she did have a syncopal episode 2 weeks associated with it.  States she was seen by her PCP office and told she likely had vertigo.  Her PCP told her to take Benadryl.  She also thought she could possibly be having intermittent episodes of hypoglycemia.  Patient states she will get lightheaded and dizzy.  Intermittently she will become shaky and feel like she is going to pass out.  Patient states she feels like she has to lay down for her symptoms.  States she did take the Benadryl "a couple times" however it made her drowsy so she has not been taking this.  States over the last 2 days she has had a headache which is located in her posterior head which occasionally radiates down to the left side of her neck.  Denies sudden onset thunderclap headache.  She has no vision changes, unilateral weakness, droopy face, slurred speech.  She denies any chest pain however states she will intermittently become short of breath over the last month.  Shortness of breath is not related to activity she denies pleuritic chest pain, mopped assist, history of PE or DVT.  She denies any risk factors for DVT.  She has been ambulatory without difficulty.  She states she is frustrated that she has had these symptoms going on for prolonged period of time does not have an answer.  Denies any additional aggravating or alleviating factors.  History obtained from patient and past medical records.  No interpreter was used.     HPI  History reviewed. No pertinent past medical  history.  There are no active problems to display for this patient.  Histories reviewed   OB History   No obstetric history on file.      Home Medications    Prior to Admission medications   Medication Sig Start Date End Date Taking? Authorizing Provider  b complex vitamins tablet Take 1 tablet by mouth once.   Yes [provider]  Cholecalciferol (VITAMIN D3 PO) Take 1 tablet by mouth daily.   Yes [provider]  ibuprofen (ADVIL) 200 MG tablet Take 200-400 mg by mouth every 4 (four) hours as needed for headache (migraine).   Yes [provider]  MAGNESIUM PO Take 1 tablet by mouth once.   Yes [provider]  Multiple Vitamin (MULTIVITAMIN WITH MINERALS) TABS tablet Take 1 tablet by mouth daily.   Yes [provider]  meclizine (ANTIVERT) 25 MG tablet Take 1 tablet (25 mg total) by mouth 3 (three) times daily as needed for dizziness. 04/08/19   Toshiko Kemler A, PA-C    Family History No family history on file.  Social History Social History   Tobacco Use   Smoking status: Not on file  Substance Use Topics   Alcohol use: Not on file   Drug use: Not on file     Allergies   Gluten meal, Avelox [moxifloxacin hcl in nacl], Biaxin [clarithromycin], and Ceftin [cefuroxime axetil]   Review of Systems Review of Systems  Constitutional: Positive for  activity change and fatigue (x4 weeks). Negative for appetite change, chills, diaphoresis, fever and unexpected weight change.  HENT: Negative.   Eyes: Negative.   Respiratory: Positive for shortness of breath (intermittent).   Cardiovascular: Negative.   Gastrointestinal: Positive for nausea. Negative for abdominal distention, abdominal pain, anal bleeding, blood in stool, constipation, diarrhea and vomiting.  Genitourinary: Negative.   Musculoskeletal: Positive for neck pain. Negative for arthralgias, back pain, gait problem, joint swelling, myalgias and neck stiffness.  Skin:  Negative.   Neurological: Positive for dizziness, syncope (3 weeks ago), light-headedness and headaches. Negative for seizures, speech difficulty, weakness and numbness.  All other systems reviewed and are negative.    Physical Exam Updated Vital Signs BP 110/70    Pulse 65    Temp 98.7 F (37.1 C) (Oral)    Resp 16    SpO2 100%   Physical Exam Vitals signs and nursing note reviewed.  Constitutional:      General: She is not in acute distress.    Appearance: She is well-developed. She is not ill-appearing, toxic-appearing or diaphoretic.  HENT:     Head: Normocephalic and atraumatic.     Nose: Nose normal.     Mouth/Throat:     Mouth: Mucous membranes are moist.     Pharynx: Oropharynx is clear.  Eyes:     Pupils: Pupils are equal, round, and reactive to light.     Comments: No horizontal, vertical or rotational nystagmus   Neck:     Musculoskeletal: Full passive range of motion without pain and normal range of motion.     Trachea: Phonation normal.      Comments: Full active and passive ROM without pain No midline or paraspinal tenderness No nuchal rigidity or meningeal signs  Cardiovascular:     Rate and Rhythm: Normal rate.     Pulses: Normal pulses.     Heart sounds: Normal heart sounds. No murmur. No friction rub. No gallop.   Pulmonary:     Effort: Pulmonary effort is normal. No tachypnea or respiratory distress.     Breath sounds: Normal breath sounds and air entry. No decreased air movement. No decreased breath sounds.  Abdominal:     General: Bowel sounds are normal. There is no distension.     Palpations: Abdomen is soft.     Tenderness: There is no abdominal tenderness. There is no right CVA tenderness, left CVA tenderness, guarding or rebound.     Hernia: No hernia is present.  Musculoskeletal: Normal range of motion.     Cervical back: She exhibits tenderness.  Lymphadenopathy:     Cervical: No cervical adenopathy.  Skin:    General: Skin is warm and dry.   Neurological:     Mental Status: She is alert.     Comments: Mental Status:  Alert, oriented, thought content appropriate. Speech fluent without evidence of aphasia. Able to follow 2 step commands without difficulty.  Cranial Nerves:  II:  Peripheral visual fields grossly normal, pupils equal, round, reactive to light III,IV, VI: ptosis not present, extra-ocular motions intact bilaterally  V,VII: smile symmetric, facial light touch sensation equal VIII: hearing grossly normal bilaterally  IX,X: midline uvula rise  XI: bilateral shoulder shrug equal and strong XII: midline tongue extension  Motor:  5/5 in upper and lower extremities bilaterally including strong and equal grip strength and dorsiflexion/plantar flexion Sensory: Pinprick and light touch normal in all extremities.  Deep Tendon Reflexes: 2+ and symmetric  Cerebellar: normal finger-to-nose with bilateral  upper extremities Gait: normal gait and balance CV: distal pulses palpable throughout       ED Treatments / Results  Labs (all labs ordered are listed, but only abnormal results are displayed) Labs Reviewed  BASIC METABOLIC PANEL - Abnormal; Notable for the following components:      Result Value   Glucose, Bld 113 (*)    Creatinine, Ser 1.27 (*)    GFR calc non Af Amer 53 (*)    All other components within normal limits  CBC - Abnormal; Notable for the following components:   RDW 11.4 (*)    All other components within normal limits  CBG MONITORING, ED - Abnormal; Notable for the following components:   Glucose-Capillary 108 (*)    All other components within normal limits  URINALYSIS, ROUTINE W REFLEX MICROSCOPIC  TSH  CBG MONITORING, ED  I-STAT BETA HCG BLOOD, ED ([facility], [facility], [facility] ONLY)  TROPONIN I (HIGH SENSITIVITY)  TROPONIN I (HIGH SENSITIVITY)    EKG EKG Interpretation  Date/Time:  Monday April 08 2019 14:47:04 EDT Ventricular Rate:  89 PR Interval:  152 QRS Duration: 90 QT Interval:  370 QTC  Calculation: 450 R Axis:   87 Text Interpretation:  Normal sinus rhythm Normal ECG Confirmed by Veryl Speak [phone]) on 04/08/2019 10:40:11 PM   Radiology Ct Angio Head W Or Wo Contrast  Result Date: 04/08/2019 CLINICAL DATA:  Vertigo, episodic, peripheral; Neck pain, initial exam. Ongoing dizziness and syncopal episodes beginning 8/19. EXAM: CT ANGIOGRAPHY HEAD AND NECK TECHNIQUE: Multidetector CT imaging of the head and neck was performed using the standard protocol during bolus administration of intravenous contrast. Multiplanar CT image reconstructions and MIPs were obtained to evaluate the vascular anatomy. Carotid stenosis measurements (when applicable) are obtained utilizing NASCET criteria, using the distal internal carotid diameter as the denominator. CONTRAST:  171mL OMNIPAQUE IOHEXOL 350 MG/ML SOLN COMPARISON:  None. FINDINGS: CT HEAD FINDINGS Brain: No acute infarct, hemorrhage, or mass lesion is present. The ventricles are of normal size. No significant white matter lesions are present. No significant extraaxial fluid collection is present. Vascular: No hyperdense vessel or unexpected calcification. Skull: Calvarium is intact. No focal lytic or blastic lesions are present. Sinuses: The paranasal sinuses and mastoid air cells are clear. Orbits: The globes and orbits are within normal limits. Review of the MIP images confirms the above findings CTA NECK FINDINGS Aortic arch: A 3 vessel arch configuration is present. There is no significant atherosclerotic disease. There is no stenosis. Right carotid system: Right common carotid artery is within normal limits. Bifurcation is unremarkable. Cervical right ICA is normal. Left carotid system: The left common carotid artery is within normal limits. Bifurcation is unremarkable. Cervical left ICA is normal. Vertebral arteries: The left vertebral artery is dominant. Both vertebral arteries originate from the subclavian arteries without significant stenosis.  There is no significant stenosis of either vertebral artery in the neck. Skeleton: There is straightening of the normal cervical lordosis. No significant listhesis is present. Hyperostosis is noted in the frontal skull. Other neck: The soft tissues the neck are otherwise unremarkable. Upper chest: The lung apices are clear. Review of the MIP images confirms the above findings CTA HEAD FINDINGS Anterior circulation: The internal carotid arteries are within normal limits the skull base through the ICA termini. The A1 and M1 segments are normal. Anterior communicating artery is patent. ACA and MCA branch vessels are normal. Posterior circulation: The left vertebral artery is the dominant vessel. Left PICA origin is visualized and  normal. The right AICA is dominant. Basilar artery is normal. Both posterior cerebral arteries originate from the basilar tip. Prominent posterior communicating arteries contribute. PCA branch vessels are within normal limits. Venous sinuses: The dural sinuses are patent. The straight sinus deep cerebral veins are intact. Cortical veins are within normal limits. No significant vascular malformation is evident. Anatomic variants: None Review of the MIP images confirms the above findings IMPRESSION: 1. Normal CT of the neck. No significant stenosis or vascular abnormality to explain dizziness or syncope. 2. Normal CTA circle-of-Willis without significant proximal stenosis, aneurysm, or branch vessel occlusion. 3. Normal noncontrast CT of the head. No acute or focal lesion to explain dizziness or syncope. Electronically Signed   By: San Morelle M.D.   On: 04/08/2019 19:22   Dg Chest 2 View  Result Date: 04/08/2019 CLINICAL DATA:  Vertigo for the past 3 days. EXAM: CHEST - 2 VIEW COMPARISON:  None. FINDINGS: Normal sized heart. Clear lungs with normal vascularity. Minimal peribronchial thickening. Unremarkable bones. IMPRESSION: Minimal bronchitic changes. Electronically Signed   By:  Claudie Revering M.D.   On: 04/08/2019 20:01   Ct Angio Neck W And/or Wo Contrast  Result Date: 04/08/2019 CLINICAL DATA:  Vertigo, episodic, peripheral; Neck pain, initial exam. Ongoing dizziness and syncopal episodes beginning 8/19. EXAM: CT ANGIOGRAPHY HEAD AND NECK TECHNIQUE: Multidetector CT imaging of the head and neck was performed using the standard protocol during bolus administration of intravenous contrast. Multiplanar CT image reconstructions and MIPs were obtained to evaluate the vascular anatomy. Carotid stenosis measurements (when applicable) are obtained utilizing NASCET criteria, using the distal internal carotid diameter as the denominator. CONTRAST:  115mL OMNIPAQUE IOHEXOL 350 MG/ML SOLN COMPARISON:  None. FINDINGS: CT HEAD FINDINGS Brain: No acute infarct, hemorrhage, or mass lesion is present. The ventricles are of normal size. No significant white matter lesions are present. No significant extraaxial fluid collection is present. Vascular: No hyperdense vessel or unexpected calcification. Skull: Calvarium is intact. No focal lytic or blastic lesions are present. Sinuses: The paranasal sinuses and mastoid air cells are clear. Orbits: The globes and orbits are within normal limits. Review of the MIP images confirms the above findings CTA NECK FINDINGS Aortic arch: A 3 vessel arch configuration is present. There is no significant atherosclerotic disease. There is no stenosis. Right carotid system: Right common carotid artery is within normal limits. Bifurcation is unremarkable. Cervical right ICA is normal. Left carotid system: The left common carotid artery is within normal limits. Bifurcation is unremarkable. Cervical left ICA is normal. Vertebral arteries: The left vertebral artery is dominant. Both vertebral arteries originate from the subclavian arteries without significant stenosis. There is no significant stenosis of either vertebral artery in the neck. Skeleton: There is straightening of  the normal cervical lordosis. No significant listhesis is present. Hyperostosis is noted in the frontal skull. Other neck: The soft tissues the neck are otherwise unremarkable. Upper chest: The lung apices are clear. Review of the MIP images confirms the above findings CTA HEAD FINDINGS Anterior circulation: The internal carotid arteries are within normal limits the skull base through the ICA termini. The A1 and M1 segments are normal. Anterior communicating artery is patent. ACA and MCA branch vessels are normal. Posterior circulation: The left vertebral artery is the dominant vessel. Left PICA origin is visualized and normal. The right AICA is dominant. Basilar artery is normal. Both posterior cerebral arteries originate from the basilar tip. Prominent posterior communicating arteries contribute. PCA branch vessels are within normal limits. Venous  sinuses: The dural sinuses are patent. The straight sinus deep cerebral veins are intact. Cortical veins are within normal limits. No significant vascular malformation is evident. Anatomic variants: None Review of the MIP images confirms the above findings IMPRESSION: 1. Normal CT of the neck. No significant stenosis or vascular abnormality to explain dizziness or syncope. 2. Normal CTA circle-of-Willis without significant proximal stenosis, aneurysm, or branch vessel occlusion. 3. Normal noncontrast CT of the head. No acute or focal lesion to explain dizziness or syncope. Electronically Signed   By: San Morelle M.D.   On: 04/08/2019 19:22   Mr Brain Wo Contrast  Result Date: 04/08/2019 CLINICAL DATA:  Lung going dizziness and syncopal episodes. Severe headaches for 3 days. EXAM: MRI HEAD WITHOUT CONTRAST TECHNIQUE: Multiplanar, multiecho pulse sequences of the brain and surrounding structures were obtained without intravenous contrast. COMPARISON:  CTA head and neck 04/08/2019. CT head without contrast 05/14/2013. Report of MR head without contrast  05/23/2005 FINDINGS: Brain: No acute infarct, hemorrhage, or mass lesion is present. Scattered subcortical T2 hyperintensities involving the anterior left frontal lobe superiorly, anterior right frontal lobe more inferiorly and bilateral parietal lobes are mildly advanced for age. The ventricles are of normal size. The internal auditory canals are within normal limits. No significant extraaxial fluid collection is present. The brainstem and cerebellum are within normal limits. Vascular: Flow is present in the major intracranial arteries. Skull and upper cervical spine: The craniocervical junction is normal. Upper cervical spine is within normal limits. Marrow signal is unremarkable. Sinuses/Orbits: Circumferential mucosal thickening is evident within the sphenoid sinuses bilaterally. This is likely chronic. The paranasal sinuses and mastoid air cells are otherwise clear. The globes and orbits are within normal limits. IMPRESSION: 1. Scattered subcortical T2 hyperintensities bilaterally are mildly advanced for age. The finding is nonspecific but can be seen in the setting of chronic microvascular ischemia, a demyelinating process such as multiple sclerosis, vasculitis, complicated migraine headaches, or as the sequelae of a prior infectious or inflammatory process. 2. No acute intracranial abnormality. 3. Mild sphenoid sinus disease. Electronically Signed   By: San Morelle M.D.   On: 04/08/2019 21:59    Procedures Procedures (including critical care time)  Medications Ordered in ED Medications  LORazepam (ATIVAN) injection 1 mg (1 mg Intravenous Refused 04/08/19 2032)  sodium chloride flush (NS) 0.9 % injection 3 mL (3 mLs Intravenous Given 04/08/19 1658)  sodium chloride 0.9 % bolus 1,000 mL (0 mLs Intravenous Stopped 04/08/19 1743)  iohexol (OMNIPAQUE) 350 MG/ML injection 75 mL (100 mLs Intravenous Contrast Given 04/08/19 1820)     Initial Impression / Assessment and Plan / ED Course  I have  reviewed the triage vital signs and the nursing notes.  Pertinent labs & imaging results that were available during my care of the patient were reviewed by me and considered in my medical decision making (see chart for details).  38 year old female appears otherwise well presents for evaluation of dizziness and headache.  She has a normal neurologic exam without deficits.  Has been followed by PCP for this over the last 4 weeks.  Symptoms gradually increasing.  Today she has a left-sided headache as well as left neck pain and dizziness.  No recent injury or trauma.  Heart and lungs clear.  No evidence of infectious symptoms.  Labs obtained from triage  CBC without leukocytosis Metabolic panel with mildly elevated glucose at 113, creatinine 1.27 Pregnancy test negative Urinalysis negative Troponin negative TSH 1.72 Plain film chest without evidence of  infiltrates, cardiomegaly, pneumothorax, pulmonary edema CTA head and neck negative MR brain with hyper densities more prominent for age. EKG without ST/T changes. No STEMI.  Discussed MRI findings with Dr. Cheral Marker, Neurology.  Feels patient can follow-up outpatient.  Nonspecific findings.  Patient refused Ativan, migraine cocktail.  Discussed follow-up with PCP/neurology given recurrent symptoms.  Patient with negative skew test and normal neurologic exam.  No vertical or rotational nystagmus. Patient normal finger-nose and normal gait.  No slurred speech renal or weakness.  Doubt CVA or other central cause of vertigo.  History and physical consistent with peripheral vertigo symptoms.  We'll discharge home with meclizine. Patient instructed to followup with her primary care physician or neurology within 3 days for further evaluation. They are to return to the emergency department for new neurologic symptoms, loss of vision or other concerning symptoms.  Low suspicion for CVA, PE, dissection, ACS as cause of symptoms.  The patient has been  appropriately medically screened and/or stabilized in the ED. I have low suspicion for any other emergent medical condition which would require further screening, evaluation or treatment in the ED or require inpatient management.  Patient is hemodynamically stable and in no acute distress.  Patient able to ambulate in department prior to ED.  Evaluation does not show acute pathology that would require ongoing or additional emergent interventions while in the emergency department or further inpatient treatment.  I have discussed the diagnosis with the patient and answered all questions.  Pain is been managed while in the emergency department and patient has no further complaints prior to discharge.  Patient is comfortable with plan discussed in room and is stable for discharge at this time.  I have discussed strict return precautions for returning to the emergency department.  Patient was encouraged to follow-up with PCP/specialist refer to at discharge.     Final Clinical Impressions(s) / ED Diagnoses   Final diagnoses:  Dizziness  Acute nonintractable headache, unspecified headache type  Near syncope  AKI (acute kidney injury) [facility])    ED Discharge Orders         Ordered    Ambulatory referral to Neurology    Comments: An appointment is requested in approximately: 2 weeks   04/08/19 2233    meclizine (ANTIVERT) 25 MG tablet  3 times daily PRN     04/08/19 2233           Marelly Wehrman A, PA-C 04/08/19 2257    Veryl Speak, MD 04/08/19 2318

## 2019-04-08 NOTE — ED Triage Notes (Signed)
Pt reports ongoing dizziness with syncope episodes that began on august 19th and has been seeing her PCP for this. Pt reports her last syncopal event was 1 week ago but since she can feel when she might pass out and just lays down. Pt states she has been constantly dizzy and 2 days ago developed a headache. Today pt reports the headache has been in the posterior part of her head with pain going into her neck. Pt is tearful in triage due to the the length of time this has been ongoing.

## 2019-05-07 ENCOUNTER — Ambulatory Visit: Payer: Self-pay | Admitting: Neurology

## 2019-06-13 ENCOUNTER — Telehealth: Payer: Self-pay | Admitting: *Deleted

## 2019-06-13 NOTE — Telephone Encounter (Signed)
Dr. Jaynee Eagles will not be in the office at pt's appt time on Tues 11/17 @ 8:00 AM. Pt will need to be rescheduled to another time. When pt calls back, please offer Monday 11/16 @ 3:30 PM (appt will need to be added- please let me know so I can do that) or schedule her at 2 PM on Wednesday 11/18. If those do not work for her, r/s for soonest available.

## 2019-06-17 ENCOUNTER — Encounter: Payer: Self-pay | Admitting: Neurology

## 2019-06-17 NOTE — Telephone Encounter (Addendum)
Called pt and LVM advising MD will be out of the office tomorrow morning 11/17 and appt will need to be rescheduled. Left office number in message. Lovena Le to send letter to pt as well.   If pt calls back and can come this afternoon, please let me know so we can see if there is still availability. Otherwise pt will need to be r/s to other availability.

## 2019-06-17 NOTE — Telephone Encounter (Addendum)
Pt returned call. Pt has r/s to another date due to her not being able to come in today.

## 2019-06-18 ENCOUNTER — Ambulatory Visit: Payer: 59 | Admitting: Neurology

## 2019-07-18 ENCOUNTER — Ambulatory Visit: Payer: 59 | Admitting: Neurology

## 2020-10-24 ENCOUNTER — Emergency Department (HOSPITAL_BASED_OUTPATIENT_CLINIC_OR_DEPARTMENT_OTHER)
Admission: EM | Admit: 2020-10-24 | Discharge: 2020-10-24 | Disposition: A | Discharge status: Home / Self Care | Payer: 59 | Attending: Emergency Medicine | Admitting: Emergency Medicine

## 2020-10-24 ENCOUNTER — Encounter (HOSPITAL_BASED_OUTPATIENT_CLINIC_OR_DEPARTMENT_OTHER): Payer: Self-pay | Admitting: Emergency Medicine

## 2020-10-24 ENCOUNTER — Other Ambulatory Visit: Payer: Self-pay

## 2020-10-24 DIAGNOSIS — R42 Dizziness and giddiness: Secondary | ICD-10-CM | POA: Insufficient documentation

## 2020-10-24 DIAGNOSIS — R55 Syncope and collapse: Secondary | ICD-10-CM | POA: Insufficient documentation

## 2020-10-24 DIAGNOSIS — R0602 Shortness of breath: Secondary | ICD-10-CM | POA: Insufficient documentation

## 2020-10-24 DIAGNOSIS — R202 Paresthesia of skin: Secondary | ICD-10-CM | POA: Insufficient documentation

## 2020-10-24 DIAGNOSIS — R61 Generalized hyperhidrosis: Secondary | ICD-10-CM | POA: Insufficient documentation

## 2020-10-24 DIAGNOSIS — R002 Palpitations: Secondary | ICD-10-CM | POA: Diagnosis not present

## 2020-10-24 DIAGNOSIS — F419 Anxiety disorder, unspecified: Secondary | ICD-10-CM | POA: Insufficient documentation

## 2020-10-24 LAB — COMPREHENSIVE METABOLIC PANEL
ALT: 16 U/L (ref 0–44)
AST: 17 U/L (ref 15–41)
Albumin: 4.5 g/dL (ref 3.5–5.0)
Alkaline Phosphatase: 40 U/L (ref 38–126)
Anion gap: 9 (ref 5–15)
BUN: 16 mg/dL (ref 6–20)
CO2: 26 mmol/L (ref 22–32)
Calcium: 9 mg/dL (ref 8.9–10.3)
Chloride: 101 mmol/L (ref 98–111)
Creatinine, Ser: 0.92 mg/dL (ref 0.44–1.00)
GFR, Estimated: >60 mL/min (ref >60)
Glucose, Bld: 137 mg/dL — ABNORMAL HIGH (ref 70–99)
Potassium: 3.4 mmol/L — ABNORMAL LOW (ref 3.5–5.1)
Sodium: 136 mmol/L (ref 135–145)
Total Bilirubin: 0.4 mg/dL (ref 0.3–1.2)
Total Protein: 7 g/dL (ref 6.5–8.1)

## 2020-10-24 LAB — CK: Total CK: 74 U/L (ref 38–234)

## 2020-10-24 MED ORDER — LORAZEPAM 2 MG/ML IJ SOLN
0.5000 mg | Freq: Once | INTRAMUSCULAR | Status: AC
  Administered 2020-10-24: 0.5 mg via INTRAVENOUS
  Filled 2020-10-24: qty 1

## 2020-10-24 NOTE — ED Provider Notes (Signed)
Care was taken over from Dr. Christy Gentles.  Patient had presented with palpitations and anxiety after ingesting some licorice tea.  Reportedly this tea has a propensity for causing hypokalemia and rhabdomyolysis.  Her labs were evaluated and are nonconcerning.  Her potassium is just under the normal range.  Her CK is normal.  Her EKG shows a sinus rhythm without any arrhythmias.  She has not had any identified arrhythmias since being on the monitor in the emergency department.  She is feeling better after little dose of Ativan.  She was discharged home in good condition.  Return precautions were given.   Malvin Johns, MD 10/24/20 [phone]

## 2020-10-24 NOTE — ED Notes (Signed)
EKG presented to MD [facility].

## 2020-10-24 NOTE — ED Provider Notes (Signed)
[facility] EMERGENCY DEPT Provider Note   CSN: [id number] Arrival date & time: 10/24/20  1607     History Chief Complaint  Patient presents with  . Palpitations    Anxiety     Michelle Sullivan is a 40 y.o. female.  The history is provided by the patient and the spouse.  Palpitations Palpitations quality:  Fast Onset quality:  Sudden Timing:  Constant Progression:  Worsening Chronicity:  New Context comment:  Licorice tea Relieved by:  None tried Worsened by:  Nothing Associated symptoms: diaphoresis, dizziness, near-syncope, numbness and shortness of breath   Associated symptoms: no chest pain, no syncope and no vomiting   Risk factors: no hx of atrial fibrillation and no hx of PE   Patient reports she was having a tea party with her daughter yesterday.  She reports she utilized an herbal tea with licorice.  She has never had this before.  She reports she had 3 small cups of the drink, and soon after began feeling her heart race.  She reports her heart is racing and she feels lightheaded.  No syncope.  No chest pain.  No previous history of atrial fibrillation or dysrhythmia.  No history of PE No drug abuse.  She has been using Benadryl for allergies, but no other OTC medications   She reports waking up several hours ago diaphoretic and very anxious and her heart continues to race.  She reports tingling and numbness throughout her extremities.  She had otherwise been at her baseline prior to drinking the tea Patient takes a daily regimen of vitamins, but no recent change in these and has never had this reaction previously  PMH-none Soc hx - denies drug use OB History   No obstetric history on file.     No family history on file.  Social History   Tobacco Use  . Smoking status: Never Smoker  . Smokeless tobacco: Never Used  Vaping Use  . Vaping Use: Never used  Substance Use Topics  . Alcohol use: Not Currently  . Drug use: Not Currently     Home Medications Prior to Admission medications   Medication Sig Start Date End Date Taking? Authorizing Provider  b complex vitamins tablet Take 1 tablet by mouth once.    [provider]  Cholecalciferol (VITAMIN D3 PO) Take 1 tablet by mouth daily.    [provider]  ibuprofen (ADVIL) 200 MG tablet Take 200-400 mg by mouth every 4 (four) hours as needed for headache (migraine).    [provider]  MAGNESIUM PO Take 1 tablet by mouth once.    [provider]  meclizine (ANTIVERT) 25 MG tablet Take 1 tablet (25 mg total) by mouth 3 (three) times daily as needed for dizziness. 04/08/19   Henderly, Britni A, PA-C  Multiple Vitamin (MULTIVITAMIN WITH MINERALS) TABS tablet Take 1 tablet by mouth daily.    [provider]    Allergies    Gluten meal, Avelox [moxifloxacin hcl in nacl], Biaxin [clarithromycin], and Ceftin [cefuroxime axetil]  Review of Systems   Review of Systems  Constitutional: Positive for diaphoresis.  Respiratory: Positive for shortness of breath.   Cardiovascular: Positive for palpitations and near-syncope. Negative for chest pain and syncope.  Gastrointestinal: Negative for vomiting.  Musculoskeletal: Positive for myalgias.  Neurological: Positive for dizziness, light-headedness and numbness. Negative for syncope.  Psychiatric/Behavioral: The patient is nervous/anxious.   All other systems reviewed and are negative.   Physical Exam Updated Vital Signs BP Marland Kitchen)  151/76 (BP Location: Left Arm)   Pulse 82   Temp 98.1 F (36.7 C) (Oral)   Resp 20   Ht 1.6 m (5\' 3" )   Wt 65.3 kg   SpO2 100%   BMI 25.51 kg/m   Physical Exam CONSTITUTIONAL: Well developed/well nourished, anxious HEAD: Normocephalic/atraumatic EYES: EOMI/PERRL, no nystagmus ENMT: Mucous membranes moist NECK: supple no meningeal signs SPINE/BACK:entire spine nontender CV: S1/S2 noted, no murmurs/rubs/gallops noted LUNGS: Lungs are clear to  auscultation bilaterally, no apparent distress ABDOMEN: soft, nontender NEURO: Pt is awake/alert/appropriate, moves all extremitiesx4.  No facial droop.  No arm or leg drift, no tremor EXTREMITIES: pulses normal/equal, full ROM, no calf tenderness or edema SKIN: warm, color normal PSYCH: Anxious  ED Results / Procedures / Treatments   Labs (all labs ordered are listed, but only abnormal results are displayed) Labs Reviewed  COMPREHENSIVE METABOLIC PANEL - Abnormal; Notable for the following components:      Result Value   Potassium 3.4 (*)    Glucose, Bld 137 (*)    All other components within normal limits  CK    EKG None  Radiology No results found.  Procedures Procedures   Medications Ordered in ED Medications  LORazepam (ATIVAN) injection 0.5 mg (0.5 mg Intravenous Given 10/24/20 0640)    ED Course  I have reviewed the triage vital signs and the nursing notes.  Pertinent labs  results that were available during my care of the patient were reviewed by me and considered in my medical decision making (see chart for details).    MDM Rules/Calculators/A&P                          6:47 AM Patient presenting with palpitations since drinking herbal tea with licorice yesterday. Patient appears anxious.  Initial EKG revealed sinus rhythm. Patient agrees to have a dose of Ativan. It appears licorice can lead to hypokalemia and rhabdomyolysis. Labs have been ordered. 7:02 AM At signout to Dr Tamera Punt, f/u on labs.  Anticipate d/c home Final Clinical Impression(s) / ED Diagnoses Final diagnoses:  Palpitations    Rx / DC Orders ED Discharge Orders    None       Ripley Fraise, MD 10/24/20 [phone]

## 2020-10-24 NOTE — ED Triage Notes (Signed)
Patient comes in with palpitations that have been going on since 1800 last night.  Patient states she drank several cups of herbal tea with licorice root in it.  Patient went to sleep and woke up at 0000 diaphoretic and anxious.  Patient tried several remedies she read about online before coming in.  Patient states no alcohol or illegal drug use.  No pain at this time.

## 2020-10-24 NOTE — ED Notes (Signed)
Pt states she is feeeling better. Ambulated to BR. VSS. Continue to monitor.

## 2021-05-15 IMAGING — MR MR HEAD W/O CM
9 of 10 series · 34 of 48 positions shown · IV contrast (Yes MH)
Comparison: CTA head and neck 04/08/2019.

CLINICAL DATA: Lung going dizziness and syncopal episodes. Severe
headaches for 3 days.

EXAM:
MRI HEAD WITHOUT CONTRAST
TECHNIQUE: Multiplanar, multiecho pulse sequences of the brain and surrounding
structures were obtained without intravenous contrast.

[Series 2: DWI · axial · 3.0mm · 0.94mm/px · z∈[-100,+54]mm · 8 of 106 slices shown (1 of 2)]
[im 1/106]
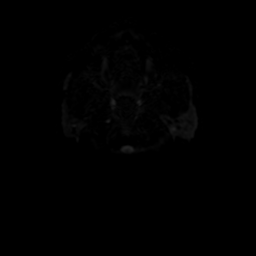
[im 12/106]
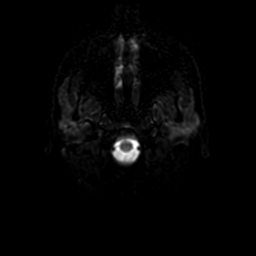
[im 36/106]
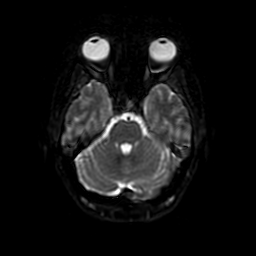
[im 47/106]
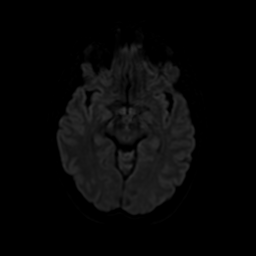
[im 59/106]
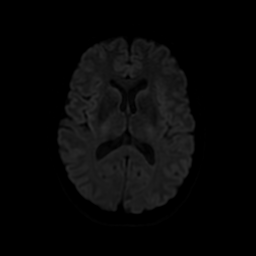
[im 71/106]
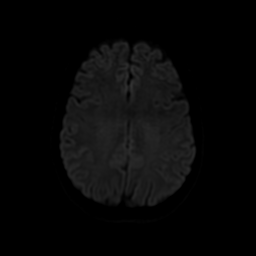
[im 94/106]
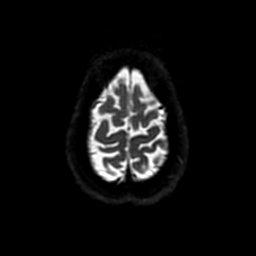
[im 106/106]
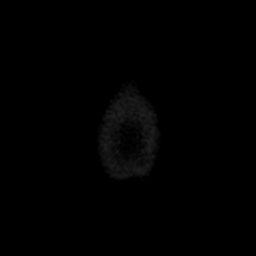

[Series 3: DWI · coronal · 4.0mm · 0.94mm/px · 6 of 72 slices shown (2 of 2)]
[im 1/72]
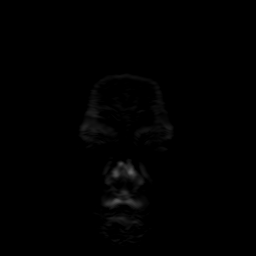
[im 15/72]
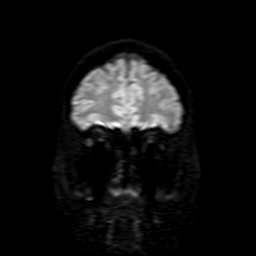
[im 29/72]
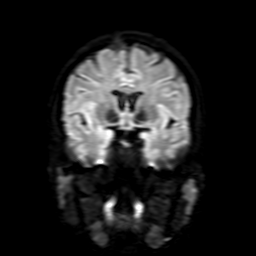
[im 43/72]
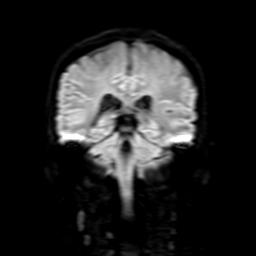
[im 57/72]
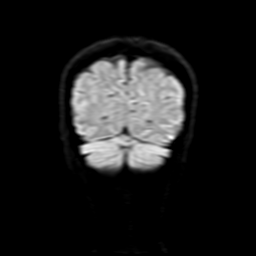
[im 72/72]
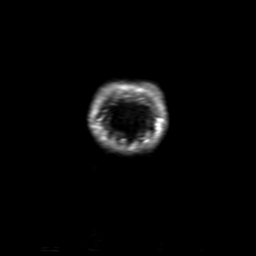

[Series 4: FLAIR · sagittal · 5.0mm · 0.47mm/px · 2 of 23 slices shown (1 of 2)]
[im 1/23]
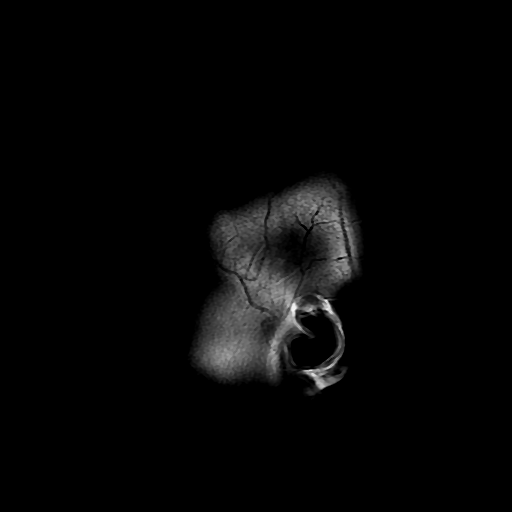
[im 23/23]
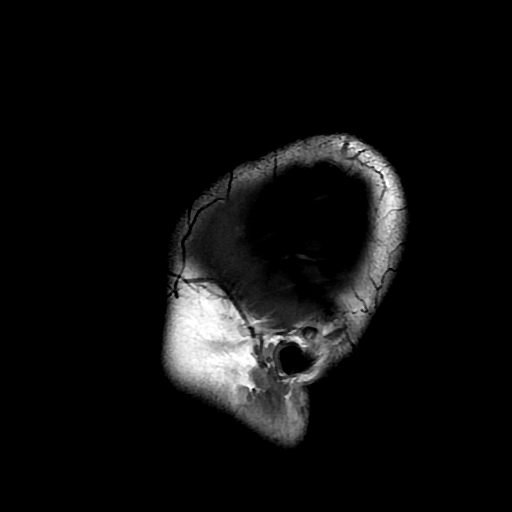

[Series 5: T2 · axial · 5.0mm · 0.47mm/px · z∈[-100,+54]mm · 2 of 27 slices shown]
[im 1/27]
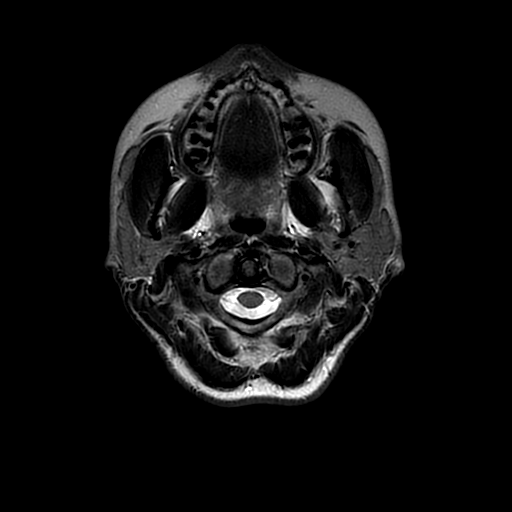
[im 27/27]
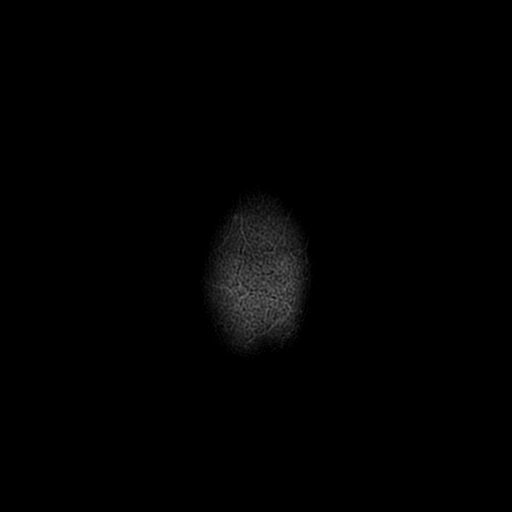

[Series 6: FLAIR · axial · 3.0mm · 0.47mm/px · z∈[-100,+54]mm · 2 of 27 slices shown (2 of 2)]
[im 1/27]
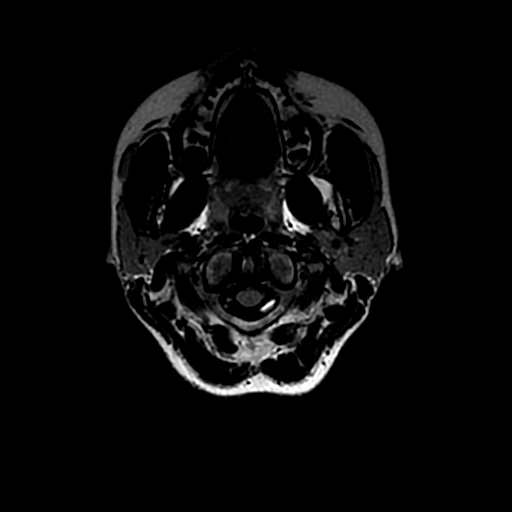
[im 27/27]
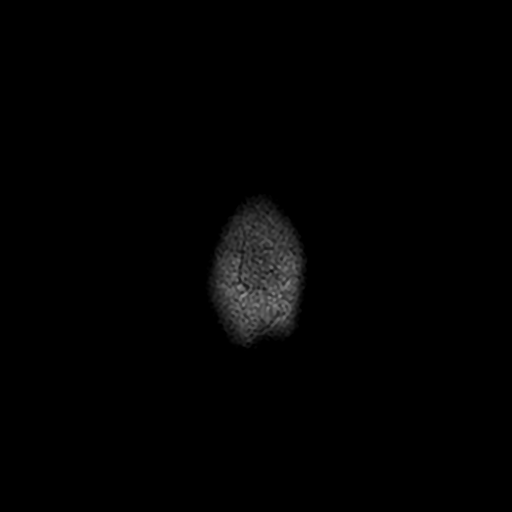

[Series 7: SWI · axial · 3.0mm · 0.47mm/px · z∈[-103,-51]mm · 3 of 108 slices shown]
[im 1/108]
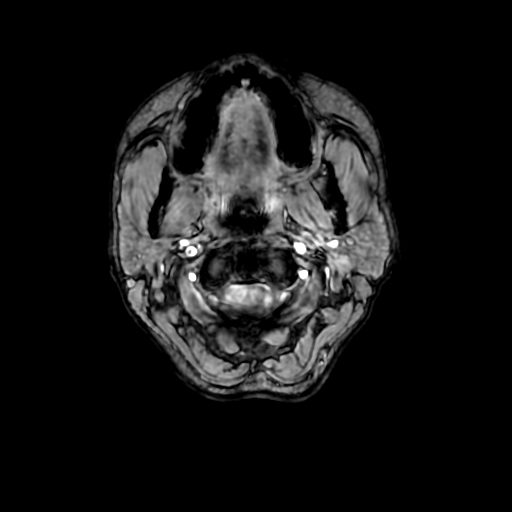
[im 12/108]
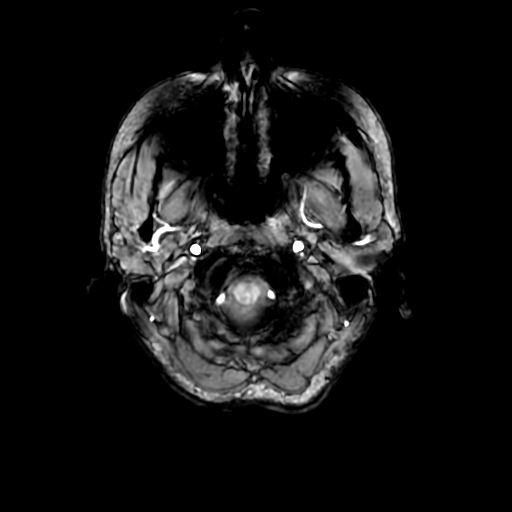
[im 36/108]
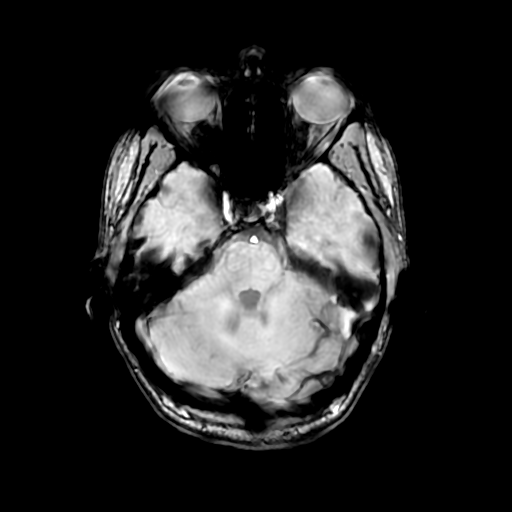

[Series 9: T2 post-contrast · coronal · 5.0mm · 0.47mm/px · 3 of 30 slices shown]
[im 1/30]
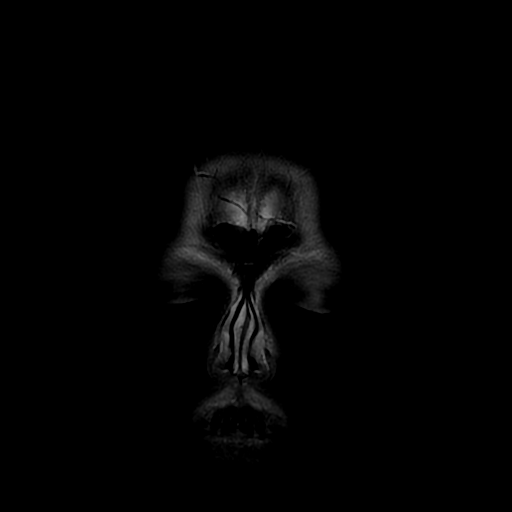
[im 15/30]
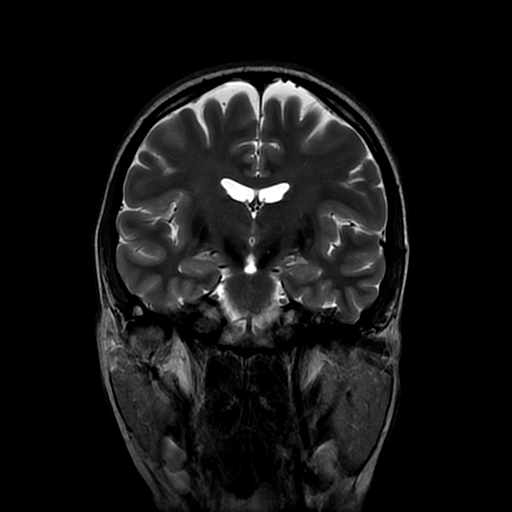
[im 30/30]
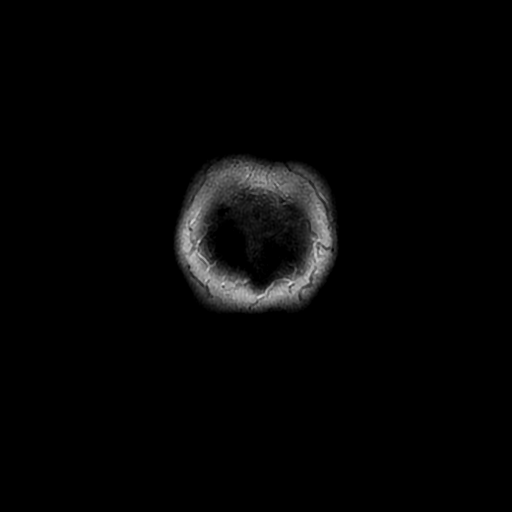

[Series 250: ADC · axial · 3.0mm · 0.94mm/px · z∈[-100,+54]mm · 5 of 51 slices shown (1 of 2)]
[im 1/51]
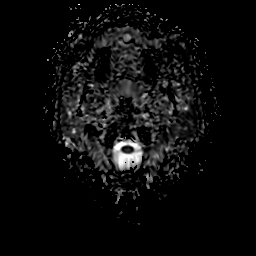
[im 13/51]
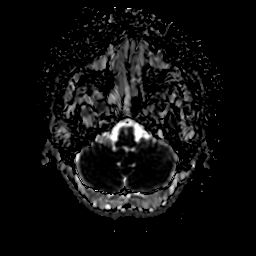
[im 26/51]
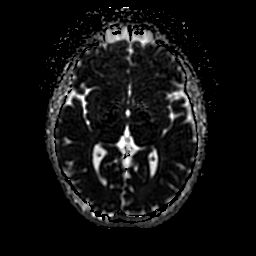
[im 38/51]
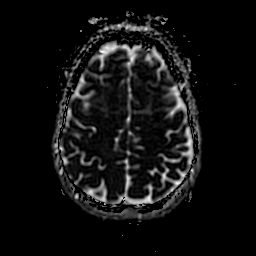
[im 51/51]
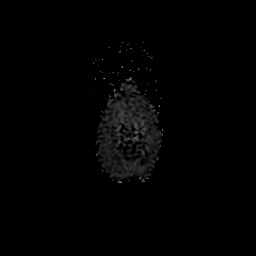

[Series 350: ADC · coronal · 4.0mm · 0.94mm/px · 3 of 36 slices shown (2 of 2)]
[im 1/36]
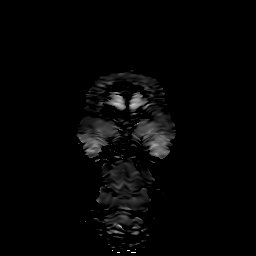
[im 18/36]
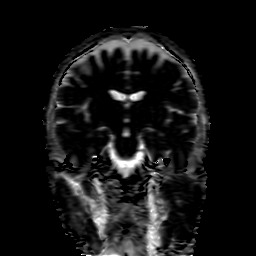
[im 36/36]
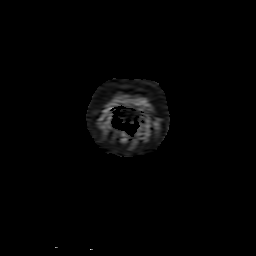

[34 of 48 positions shown; findings below may reference images not displayed]

CT head without contrast
05/14/2013. Report of MR head without contrast 05/23/2005
FINDINGS: Brain: No acute infarct, hemorrhage, or mass lesion is present.
Scattered subcortical T2 hyperintensities involving the anterior
left frontal lobe superiorly, anterior right frontal lobe more
inferiorly and bilateral parietal lobes are mildly advanced for age.
The ventricles are of normal size. The internal auditory canals are
within normal limits. No significant extraaxial fluid collection is
present. The brainstem and cerebellum are within normal limits.

Vascular: Flow is present in the major intracranial arteries.

Skull and upper cervical spine: The craniocervical junction is
normal. Upper cervical spine is within normal limits. Marrow signal
is unremarkable.

Sinuses/Orbits: Circumferential mucosal thickening is evident within
the sphenoid sinuses bilaterally. This is likely chronic. The
paranasal sinuses and mastoid air cells are otherwise clear. The
globes and orbits are within normal limits.
IMPRESSION: 1. Scattered subcortical T2 hyperintensities bilaterally are mildly
advanced for age. The finding is nonspecific but can be seen in the
setting of chronic microvascular ischemia, a demyelinating process
such as multiple sclerosis, vasculitis, complicated migraine
headaches, or as the sequelae of a prior infectious or inflammatory
process.
2. No acute intracranial abnormality.
3. Mild sphenoid sinus disease.

## 2022-11-28 NOTE — Progress Notes (Unsigned)
New Patient Note  RE: Michelle Sullivan MRN: [id number] DOB: [birth year] Date of Office Visit: 11/29/2022  Consult requested by: Georgianne Fick, MD Primary care provider: Georgianne Fick, MD  Chief Complaint: Other (Pt states dry throat and facial swelling and feeling pressure in her ears and neck for about three weeks when she is outside  but states when inside she is ok. Pt states she feels itchy and burning a little bit mostly on neck.also feels dizzy when this happens.)  History of Present Illness: I had the pleasure of seeing Michelle Sullivan for initial evaluation at the [facility] on 11/29/2022. She is a 42 y.o. female, who is self-referred here for the evaluation of allergies.  She reports symptoms of dry throat, burning sensation on the neck, face/neck swelling sensation - but no visible swelling, ear pressure, sinus pressure, rash. Symptoms have been going on for 3 years. The symptoms are present mainly in the spring/summer.   Other triggers include exposure to outdoors. Anosmia: no. Headache: no. She has used Claritin with some improvement in symptoms. Benadryl causes drowsiness. Allegra caused palpitations.   Sinus infections: no. Previous work up includes: none. Previous ENT evaluation: yes for ear pain and migraines.  Previous sinus imaging: no. History of nasal polyps: no. History of reflux: no.  Assessment and Plan: [name] is a 42 y.o. female with: Other allergic rhinitis Noted ear/sinus pressure, dry throat and sensation of facial swelling, rash. Mainly occurs in the spring/summer after being outdoors. Tried Claritin with some benefit. Benadryl caused drowsiness, allegra caused palpations. No prior allergy testing. Likes being outdoors. Unable to skin test today as [facility] won't allow new patient visits and procedures on the same day.   Start environmental control measures as below - history concerning for tree/grass pollen  allergy. Continue Claritin (loratadine) daily as needed. May take twice a day during allergy flares.  Nasal saline spray (i.e., Simply Saline) or nasal saline lavage (i.e., NeilMed) is recommended as needed and prior to medicated nasal sprays. Return for skin testing - if unable to wean off Claritin then get bloodwork instead.   Shortness of breath Sometimes feels SOB with above symptoms. No prior asthma diagnosis or inhaler use. Today's spirometry was normal. Monitor symptoms - consider adding on albuterol prn if more frequent.   Other adverse food reactions, not elsewhere classified, subsequent encounter Gluten causes diarrhea and headaches. Dairy causes diarrhea. No issues with goat milk. Continue to avoid gluten and cow's milk. Based on history most likely has intolerance but plan on testing for wheat, milk and casein.   Return for Skin testing.  No orders of the defined types were placed in this encounter.  Lab Orders         Allergens w/Total IgE Area 2      Other allergy screening: Asthma: no Food allergy:  diarrhea with gluten and dairy. Headaches with gluten. Tolerates goat milk.  Medication allergy: yes Colitis with Biaxin requiring hospitalization.  Hymenoptera allergy: no Large localized reactions.   Urticaria: no Eczema:no History of recurrent infections suggestive of immunodeficency: no  Diagnostics: Spirometry:  Tracings reviewed. Her effort: Good reproducible efforts. FVC: 4.22L FEV1: 3.36L, 110% predicted FEV1/FVC ratio: 80% Interpretation: Spirometry consistent with normal pattern.  Please see scanned spirometry results for details.   Skin Testing: Unable to skin test today as [facility] won't allow new patient visits and procedures on the same day.   Past Medical History: Patient Active Problem List   Diagnosis Date Noted  Other allergic rhinitis 11/29/2022   Other adverse food reactions, not elsewhere classified, subsequent encounter 11/29/2022    Shortness of breath 11/29/2022   History reviewed. No pertinent past medical history. Past Surgical History: History reviewed. No pertinent surgical history. Medication List:  Current Outpatient Medications  Medication Sig Dispense Refill   Cholecalciferol (VITAMIN D3 PO) Take 1 tablet by mouth daily.     MAGNESIUM PO Take 1 tablet by mouth once.     Multiple Vitamin (MULTIVITAMIN WITH MINERALS) TABS tablet Take 1 tablet by mouth daily.     b complex vitamins tablet Take 1 tablet by mouth once. (Patient not taking: Reported on 11/29/2022)     No current facility-administered medications for this visit.   Allergies: Allergies  Allergen Reactions   Gluten Meal Diarrhea and Other (See Comments)    migraines   Avelox [Moxifloxacin Hcl In Nacl] Rash   Biaxin [Clarithromycin] Diarrhea    Caused severe colitis   Ceftin [Cefuroxime Axetil] Rash   Social History: Social History   Socioeconomic History   Marital status: Married    Spouse name: Not on file   Number of children: Not on file   Years of education: Not on file   Highest education level: Not on file  Occupational History   Not on file  Tobacco Use   Smoking status: Never   Smokeless tobacco: Never  Vaping Use   Vaping Use: Never used  Substance and Sexual Activity   Alcohol use: Not Currently   Drug use: Not Currently   Sexual activity: Not on file  Other Topics Concern   Not on file  Social History Narrative   Not on file   Social Determinants of Health   Financial Resource Strain: Not on file  Food Insecurity: Not on file  Transportation Needs: Not on file  Physical Activity: Not on file  Stress: Not on file  Social Connections: Not on file   Lives in a house which is 44 years old. Smoking: denies Occupation: homemaker  Environmental HistorySurveyor, minerals in the house: no Carpet in the family room: no Carpet in the bedroom: yes Heating: electric Cooling: central Pet: yes 1 dog and 1  cat Goats, honeybees, chickens outdoors.   Family History: Family History  Problem Relation Age of Onset   Allergic rhinitis Mother    Allergic rhinitis Sister    Review of Systems  Constitutional:  Negative for appetite change, chills, fever and unexpected weight change.  HENT:  Positive for ear pain and sinus pressure. Negative for congestion and rhinorrhea.   Eyes:  Negative for itching.  Respiratory:  Positive for shortness of breath. Negative for cough, chest tightness and wheezing.   Cardiovascular:  Negative for chest pain.  Gastrointestinal:  Negative for abdominal pain.  Genitourinary:  Negative for difficulty urinating.  Skin:  Positive for rash.  Neurological:  Positive for headaches.    Objective: BP 116/70   Pulse 89   Temp 98 F (36.7 C)   Resp 20   Ht 5\' 5"  (1.651 m)   Wt 156 lb (70.8 kg)   SpO2 99%   BMI 25.96 kg/m  Body mass index is 25.96 kg/m. Physical Exam Vitals and nursing note reviewed.  Constitutional:      Appearance: Normal appearance. She is well-developed.  HENT:     Head: Normocephalic and atraumatic.     Right Ear: Tympanic membrane and external ear normal.     Left Ear: Tympanic membrane and external ear normal.  Nose: Nose normal.     Mouth/Throat:     Mouth: Mucous membranes are moist.     Pharynx: Oropharynx is clear.  Eyes:     Conjunctiva/sclera: Conjunctivae normal.  Cardiovascular:     Rate and Rhythm: Normal rate and regular rhythm.     Heart sounds: Normal heart sounds. No murmur heard.    No friction rub. No gallop.  Pulmonary:     Effort: Pulmonary effort is normal.     Breath sounds: Normal breath sounds. No wheezing, rhonchi or rales.  Musculoskeletal:     Cervical back: Neck supple.  Skin:    General: Skin is warm.     Findings: No rash.  Neurological:     Mental Status: She is alert and oriented to person, place, and time.  Psychiatric:        Behavior: Behavior normal.    The plan was reviewed with the  patient/family, and all questions/concerned were addressed.  It was my pleasure to see Aanya today and participate in her care. Please feel free to contact me with any questions or concerns.  Sincerely,  Wyline Mood, DO Allergy & Immunology  [facility] office: [phone] [facility] office: [phone]

## 2022-11-29 ENCOUNTER — Encounter: Payer: Self-pay | Admitting: Allergy

## 2022-11-29 ENCOUNTER — Other Ambulatory Visit: Payer: Self-pay

## 2022-11-29 ENCOUNTER — Ambulatory Visit (INDEPENDENT_AMBULATORY_CARE_PROVIDER_SITE_OTHER): Payer: 59 | Admitting: Allergy

## 2022-11-29 VITALS — BP 116/70 | HR 89 | Temp 98.0°F | Resp 20 | Ht 65.0 in | Wt 156.0 lb

## 2022-11-29 DIAGNOSIS — R0602 Shortness of breath: Secondary | ICD-10-CM

## 2022-11-29 DIAGNOSIS — T781XXD Other adverse food reactions, not elsewhere classified, subsequent encounter: Secondary | ICD-10-CM | POA: Diagnosis not present

## 2022-11-29 DIAGNOSIS — J3089 Other allergic rhinitis: Secondary | ICD-10-CM | POA: Insufficient documentation

## 2022-11-29 NOTE — Assessment & Plan Note (Signed)
Noted ear/sinus pressure, dry throat and sensation of facial swelling, rash. Mainly occurs in the spring/summer after being outdoors. Tried Claritin with some benefit. Benadryl caused drowsiness, allegra caused palpations. No prior allergy testing. Likes being outdoors. Unable to skin test today as [facility] won't allow new patient visits and procedures on the same day.   Start environmental control measures as below - history concerning for tree/grass pollen allergy. Continue Claritin (loratadine) daily as needed. May take twice a day during allergy flares.  Nasal saline spray (i.e., Simply Saline) or nasal saline lavage (i.e., NeilMed) is recommended as needed and prior to medicated nasal sprays. Return for skin testing - if unable to wean off Claritin then get bloodwork instead.

## 2022-11-29 NOTE — Patient Instructions (Addendum)
Unable to skin test today as [facility] won't allow new patient visits and procedures on the same day.    Environmental allergies Start environmental control measures as below. Continue Claritin (loratadine) daily as needed. May take twice a day during allergy flares.  Nasal saline spray (i.e., Simply Saline) or nasal saline lavage (i.e., NeilMed) is recommended as needed and prior to medicated nasal sprays.  Food Continue to avoid gluten and cow's milk.   Normal breathing test today. Monitor symptoms.  Follow up for skin testing. Hold Claritin for 3 days minimum. If you are not able to come off Claritin then get bloodwork.   Reducing Pollen Exposure Pollen seasons: trees (spring), grass (summer) and ragweed/weeds (fall). Keep windows closed in your home and car to lower pollen exposure.  Install air conditioning in the bedroom and throughout the house if possible.  Avoid going out in dry windy days - especially early morning. Pollen counts are highest between 5 - 10 AM and on dry, hot and windy days.  Save outside activities for late afternoon or after a heavy rain, when pollen levels are lower.  Avoid mowing of grass if you have grass pollen allergy. Be aware that pollen can also be transported indoors on people and pets.  Dry your clothes in an automatic dryer rather than hanging them outside where they might collect pollen.  Rinse hair and eyes before bedtime.

## 2022-11-29 NOTE — Assessment & Plan Note (Signed)
Sometimes feels SOB with above symptoms. No prior asthma diagnosis or inhaler use. Today's spirometry was normal. Monitor symptoms - consider adding on albuterol prn if more frequent.

## 2022-11-29 NOTE — Assessment & Plan Note (Signed)
Gluten causes diarrhea and headaches. Dairy causes diarrhea. No issues with goat milk. Continue to avoid gluten and cow's milk. Based on history most likely has intolerance but plan on testing for wheat, milk and casein.

## 2022-12-07 NOTE — Progress Notes (Unsigned)
Follow Up Note  RE: Michelle Sullivan MRN: [id number] DOB: 1981/06/25 Date of Office Visit: 12/08/2022  Referring provider: Georgianne Fick, MD Primary care provider: Georgianne Fick, MD  Chief Complaint: No chief complaint on file.  History of Present Illness: I had the pleasure of seeing Michelle Sullivan for a follow up visit at the [facility] on 12/07/2022. She is a 42 y.o. female, who is being followed for allergic rhinitis, shortness of breath and adverse food reaction. Her previous allergy office visit was on 11/29/2022 with Dr. Selena Batten. Today is a skin testing and follow up visit.  Other allergic rhinitis Noted ear/sinus pressure, dry throat and sensation of facial swelling, rash. Mainly occurs in the spring/summer after being outdoors. Tried Claritin with some benefit. Benadryl caused drowsiness, allegra caused palpations. No prior allergy testing. Likes being outdoors. Unable to skin test today as [facility] won't allow new patient visits and procedures on the same day.   Start environmental control measures as below - history concerning for tree/grass pollen allergy. Continue Claritin (loratadine) daily as needed. May take twice a day during allergy flares.  Nasal saline spray (i.e., Simply Saline) or nasal saline lavage (i.e., NeilMed) is recommended as needed and prior to medicated nasal sprays. Return for skin testing - if unable to wean off Claritin then get bloodwork instead.    Shortness of breath Sometimes feels SOB with above symptoms. No prior asthma diagnosis or inhaler use. Today's spirometry was normal. Monitor symptoms - consider adding on albuterol prn if more frequent.    Other adverse food reactions, not elsewhere classified, subsequent encounter Gluten causes diarrhea and headaches. Dairy causes diarrhea. No issues with goat milk. Continue to avoid gluten and cow's milk. Based on history most likely has intolerance but plan on testing for  wheat, milk and casein.    Return for Skin testing.   No orders of the defined types were placed in this encounter.   Lab Orders         Allergens w/Total IgE Area 2     Assessment and Plan: Michelle Sullivan is a 42 y.o. female with: No problem-specific Assessment & Plan notes found for this encounter.  No follow-ups on file.  No orders of the defined types were placed in this encounter.  Lab Orders  No laboratory test(s) ordered today    Diagnostics: Spirometry:  Tracings reviewed. Her effort: {Blank single:19197::"Good reproducible efforts.","It was hard to get consistent efforts and there is a question as to whether this reflects a maximal maneuver.","Poor effort, data can not be interpreted."} FVC: ***L FEV1: ***L, ***% predicted FEV1/FVC ratio: ***% Interpretation: {Blank single:19197::"Spirometry consistent with mild obstructive disease","Spirometry consistent with moderate obstructive disease","Spirometry consistent with severe obstructive disease","Spirometry consistent with possible restrictive disease","Spirometry consistent with mixed obstructive and restrictive disease","Spirometry uninterpretable due to technique","Spirometry consistent with normal pattern","No overt abnormalities noted given today's efforts"}.  Please see scanned spirometry results for details.  Skin Testing: {Blank single:19197::"Select foods","Environmental allergy panel","Environmental allergy panel and select foods","Food allergy panel","None","Deferred due to recent antihistamines use"}. *** Results discussed with patient/family.   Medication List:  Current Outpatient Medications  Medication Sig Dispense Refill  . b complex vitamins tablet Take 1 tablet by mouth once. (Patient not taking: Reported on 11/29/2022)    . Cholecalciferol (VITAMIN D3 PO) Take 1 tablet by mouth daily.    Marland Kitchen MAGNESIUM PO Take 1 tablet by mouth once.    . Multiple Vitamin (MULTIVITAMIN WITH MINERALS) TABS tablet Take 1 tablet  by  mouth daily.     No current facility-administered medications for this visit.   Allergies: Allergies  Allergen Reactions  . Gluten Meal Diarrhea and Other (See Comments)    migraines  . Avelox [Moxifloxacin Hcl In Nacl] Rash  . Biaxin [Clarithromycin] Diarrhea    Caused severe colitis  . Ceftin [Cefuroxime Axetil] Rash   I reviewed her past medical history, social history, family history, and environmental history and no significant changes have been reported from her previous visit.  Review of Systems  Constitutional:  Negative for appetite change, chills, fever and unexpected weight change.  HENT:  Positive for ear pain and sinus pressure. Negative for congestion and rhinorrhea.   Eyes:  Negative for itching.  Respiratory:  Positive for shortness of breath. Negative for cough, chest tightness and wheezing.   Cardiovascular:  Negative for chest pain.  Gastrointestinal:  Negative for abdominal pain.  Genitourinary:  Negative for difficulty urinating.  Skin:  Positive for rash.  Neurological:  Positive for headaches.   Objective: There were no vitals taken for this visit. There is no height or weight on file to calculate BMI. Physical Exam Vitals and nursing note reviewed.  Constitutional:      Appearance: Normal appearance. She is well-developed.  HENT:     Head: Normocephalic and atraumatic.     Right Ear: Tympanic membrane and external ear normal.     Left Ear: Tympanic membrane and external ear normal.     Nose: Nose normal.     Mouth/Throat:     Mouth: Mucous membranes are moist.     Pharynx: Oropharynx is clear.  Eyes:     Conjunctiva/sclera: Conjunctivae normal.  Cardiovascular:     Rate and Rhythm: Normal rate and regular rhythm.     Heart sounds: Normal heart sounds. No murmur heard.    No friction rub. No gallop.  Pulmonary:     Effort: Pulmonary effort is normal.     Breath sounds: Normal breath sounds. No wheezing, rhonchi or rales.  Musculoskeletal:      Cervical back: Neck supple.  Skin:    General: Skin is warm.     Findings: No rash.  Neurological:     Mental Status: She is alert and oriented to person, place, and time.  Psychiatric:        Behavior: Behavior normal.  Previous notes and tests were reviewed. The plan was reviewed with the patient/family, and all questions/concerned were addressed.  It was my pleasure to see Iana today and participate in her care. Please feel free to contact me with any questions or concerns.  Sincerely,  Wyline Mood, DO Allergy & Immunology  [facility] office: [phone] [facility] office: [phone]

## 2022-12-08 ENCOUNTER — Ambulatory Visit (INDEPENDENT_AMBULATORY_CARE_PROVIDER_SITE_OTHER): Payer: 59 | Admitting: Allergy

## 2022-12-08 ENCOUNTER — Encounter: Payer: Self-pay | Admitting: Allergy

## 2022-12-08 VITALS — BP 140/82 | HR 72 | Temp 97.9°F | Resp 18

## 2022-12-08 DIAGNOSIS — R0602 Shortness of breath: Secondary | ICD-10-CM | POA: Diagnosis not present

## 2022-12-08 DIAGNOSIS — J3089 Other allergic rhinitis: Secondary | ICD-10-CM

## 2022-12-08 DIAGNOSIS — T781XXD Other adverse food reactions, not elsewhere classified, subsequent encounter: Secondary | ICD-10-CM | POA: Diagnosis not present

## 2022-12-08 DIAGNOSIS — T63481A Toxic effect of venom of other arthropod, accidental (unintentional), initial encounter: Secondary | ICD-10-CM | POA: Diagnosis not present

## 2022-12-08 MED ORDER — ALBUTEROL SULFATE HFA 108 (90 BASE) MCG/ACT IN AERS: 2.0000 | INHALATION_SPRAY | RESPIRATORY_TRACT | 1 refills | Status: AC | PRN

## 2022-12-08 NOTE — Assessment & Plan Note (Signed)
Past history - noted ear/sinus pressure, dry throat and sensation of facial swelling, rash. Mainly occurs in the spring/summer after being outdoors. Tried Claritin with some benefit. Benadryl caused drowsiness, allegra caused palpations. Likes being outdoors. Today's skin testing showed: Positive to grass, weed, ragweed, red cedar tree pollen, mold, dust mites, cat, dog.  Start environmental control measures as below. Continue Claritin (loratadine) daily as needed. May take twice a day during allergy flares.  Use Flonase Sensamist (fluticasone) nasal spray 1 spray per nostril twice a day as needed for nasal congestion.  Nasal saline spray (i.e., Simply Saline) or nasal saline lavage (i.e., NeilMed) is recommended as needed and prior to medicated nasal sprays. Consider allergy injections for long term control if above medications do not help the symptoms - handout given.  Let us know when ready to start.  Had a detailed discussion with patient/family that clinical history is suggestive of allergic rhinitis, and may benefit from allergy immunotherapy (AIT). Discussed in detail regarding the dosing, schedule, side effects (mild to moderate local allergic reaction and rarely systemic allergic reactions including anaphylaxis), and benefits (significant improvement in nasal symptoms, seasonal flares of asthma) of immunotherapy with the patient. There is significant time commitment involved with allergy shots, which includes weekly immunotherapy injections for first 9-12 months and then biweekly to monthly injections for 3-5 years.

## 2022-12-08 NOTE — Assessment & Plan Note (Signed)
Past history - gluten causes diarrhea and headaches. Dairy causes diarrhea. No issues with goat milk. Today's skin testing showed: Negative to milk, casein and wheat.  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  Continue to avoid gluten and cow's milk.

## 2022-12-08 NOTE — Assessment & Plan Note (Signed)
Large localized reactions to honeybees in the past. They do keep bees. Get bloodwork. For mild symptoms you can take over the counter antihistamines such as Benadryl and monitor symptoms closely. If symptoms worsen or if you have severe symptoms including breathing issues, throat closure, significant swelling, whole body hives, severe diarrhea and vomiting, lightheadedness then seek immediate medical care.

## 2022-12-08 NOTE — Patient Instructions (Addendum)
Today's skin testing showed: Positive to grass, weed, ragweed, red cedar tree pollen, mold, dust mites, cat, dog.   Negative to milk, casein and wheat.   Results given.  Environmental allergies Start environmental control measures as below. Continue Claritin (loratadine) daily as needed. May take twice a day during allergy flares.  Use Flonase Sensamist (fluticasone) nasal spray 1 spray per nostril twice a day as needed for nasal congestion.  Nasal saline spray (i.e., Simply Saline) or nasal saline lavage (i.e., NeilMed) is recommended as needed and prior to medicated nasal sprays. Consider allergy injections for long term control if above medications do not help the symptoms - handout given.  Let us know when ready to start.   Had a detailed discussion with patient/family that clinical history is suggestive of allergic rhinitis, and may benefit from allergy immunotherapy (AIT). Discussed in detail regarding the dosing, schedule, side effects (mild to moderate local allergic reaction and rarely systemic allergic reactions including anaphylaxis), and benefits (significant improvement in nasal symptoms, seasonal flares of asthma) of immunotherapy with the patient. There is significant time commitment involved with allergy shots, which includes weekly immunotherapy injections for first 9-12 months and then biweekly to monthly injections for 3-5 years.  Food Continue to avoid gluten and cow's milk.  You most likely have some intolerance.  Lactose intolerance May use lactose free milk or take a lactaid pill right before consuming anything with dairy.  Bee stings Get bloodwork We are ordering labs, so please allow 1-2 weeks for the results to come back. With the newly implemented Cures Act, the labs might be visible to you at the same time that they become visible to me. However, I will not address the results until all of the results are back, so please be patient.  In the meantime, continue  recommendations in your patient instructions, including avoidance measures (if applicable), until you hear from me. Continue to avoid. For mild symptoms you can take over the counter antihistamines such as Benadryl and monitor symptoms closely. If symptoms worsen or if you have severe symptoms including breathing issues, throat closure, significant swelling, whole body hives, severe diarrhea and vomiting, lightheadedness then seek immediate medical care.  Breathing Monitor symptoms. May use albuterol rescue inhaler 2 puffs or nebulizer every 4 to 6 hours as needed for shortness of breath, chest tightness, coughing, and wheezing.  Monitor frequency of use - if you need to use it more than twice per week on a consistent basis let us know.   Follow up in 6 months or sooner if needed.   Reducing Pollen Exposure Pollen seasons: trees (spring), grass (summer) and ragweed/weeds (fall). Keep windows closed in your home and car to lower pollen exposure.  Install air conditioning in the bedroom and throughout the house if possible.  Avoid going out in dry windy days - especially early morning. Pollen counts are highest between 5 - 10 AM and on dry, hot and windy days.  Save outside activities for late afternoon or after a heavy rain, when pollen levels are lower.  Avoid mowing of grass if you have grass pollen allergy. Be aware that pollen can also be transported indoors on people and pets.  Dry your clothes in an automatic dryer rather than hanging them outside where they might collect pollen.  Rinse hair and eyes before bedtime.  Mold Control Mold and fungi can grow on a variety of surfaces provided certain temperature and moisture conditions exist.  Outdoor molds grow on plants, decaying vegetation  and soil. The major outdoor mold, Alternaria and Cladosporium, are found in very high numbers during hot and dry conditions. Generally, a late summer - fall peak is seen for common outdoor fungal spores.  Rain will temporarily lower outdoor mold spore count, but counts rise rapidly when the rainy period ends. The most important indoor molds are Aspergillus and Penicillium. Dark, humid and poorly ventilated basements are ideal sites for mold growth. The next most common sites of mold growth are the bathroom and the kitchen. Outdoor (Seasonal) Mold Control Use air conditioning and keep windows closed. Avoid exposure to decaying vegetation. Avoid leaf raking. Avoid grain handling. Consider wearing a face mask if working in moldy areas.  Indoor (Perennial) Mold Control  Maintain humidity below 50%. Get rid of mold growth on hard surfaces with water, detergent and, if necessary, 5% bleach (do not mix with other cleaners). Then dry the area completely. If mold covers an area more than 10 square feet, consider hiring an indoor environmental professional. For clothing, washing with soap and water is best. If moldy items cannot be cleaned and dried, throw them away. Remove sources e.g. contaminated carpets. Repair and seal leaking roofs or pipes. Using dehumidifiers in damp basements may be helpful, but empty the water and clean units regularly to prevent mildew from forming. All rooms, especially basements, bathrooms and kitchens, require ventilation and cleaning to deter mold and mildew growth. Avoid carpeting on concrete or damp floors, and storing items in damp areas. Control of House Dust Mite Allergen Dust mite allergens are a common trigger of allergy and asthma symptoms. While they can be found throughout the house, these microscopic creatures thrive in warm, humid environments such as bedding, upholstered furniture and carpeting. Because so much time is spent in the bedroom, it is essential to reduce mite levels there.  Encase pillows, mattresses, and box springs in special allergen-proof fabric covers or airtight, zippered plastic covers.  Bedding should be washed weekly in hot water (130 F) and  dried in a hot dryer. Allergen-proof covers are available for comforters and pillows that can't be regularly washed.  Wash the allergy-proof covers every few months. Minimize clutter in the bedroom. Keep pets out of the bedroom.  Keep humidity less than 50% by using a dehumidifier or air conditioning. You can buy a humidity measuring device called a hygrometer to monitor this.  If possible, replace carpets with hardwood, linoleum, or washable area rugs. If that's not possible, vacuum frequently with a vacuum that has a HEPA filter. Remove all upholstered furniture and non-washable window drapes from the bedroom. Remove all non-washable stuffed toys from the bedroom.  Wash stuffed toys weekly.  Pet Allergen Avoidance: Contrary to popular opinion, there are no "hypoallergenic" breeds of dogs or cats. That is because people are not allergic to an animal's hair, but to an allergen found in the animal's saliva, dander (dead skin flakes) or urine. Pet allergy symptoms typically occur within minutes. For some people, symptoms can build up and become most severe 8 to 12 hours after contact with the animal. People with severe allergies can experience reactions in public places if dander has been transported on the pet owners' clothing. Keeping an animal outdoors is only a partial solution, since homes with pets in the yard still have higher concentrations of animal allergens. Before getting a pet, ask your allergist to determine if you are allergic to animals. If your pet is already considered part of your family, try to minimize contact and keep  the pet out of the bedroom and other rooms where you spend a great deal of time. As with dust mites, vacuum carpets often or replace carpet with a hardwood floor, tile or linoleum. High-efficiency particulate air (HEPA) cleaners can reduce allergen levels over time. While dander and saliva are the source of cat and dog allergens, urine is the source of allergens from  rabbits, hamsters, mice and [place] pigs; so ask a non-allergic family member to clean the animal's cage. If you have a pet allergy, talk to your allergist about the potential for allergy immunotherapy (allergy shots). This strategy can often provide long-term relief.

## 2022-12-08 NOTE — Assessment & Plan Note (Signed)
Past history - sometimes feels SOB with above symptoms. No prior asthma diagnosis or inhaler use. 2024 spirometry was normal. Monitor symptoms. May use albuterol rescue inhaler 2 puffs or nebulizer every 4 to 6 hours as needed for shortness of breath, chest tightness, coughing, and wheezing.  Monitor frequency of use - if you need to use it more than twice per week on a consistent basis let us know.

## 2023-06-08 ENCOUNTER — Ambulatory Visit: Payer: 59 | Admitting: Allergy
